# Patient Record
Sex: Male | Born: 1970
Health system: Southern US, Community
[De-identification: ages and names within clinical notes are randomized; demographics above are authoritative.]

## PROBLEM LIST (undated history)

## (undated) DIAGNOSIS — J9819 Other pulmonary collapse: Secondary | ICD-10-CM

## (undated) DIAGNOSIS — M5430 Sciatica, unspecified side: Secondary | ICD-10-CM

## (undated) DIAGNOSIS — S3992XA Unspecified injury of lower back, initial encounter: Secondary | ICD-10-CM

## (undated) DIAGNOSIS — E785 Hyperlipidemia, unspecified: Secondary | ICD-10-CM

## (undated) HISTORY — PX: LUNG SURGERY: SHX703

## (undated) HISTORY — PX: BACK SURGERY: SHX140

---

## 1997-09-16 ENCOUNTER — Ambulatory Visit (HOSPITAL_COMMUNITY): Admission: RE | Admit: 1997-09-16 | Discharge: 1997-09-16 | Payer: Self-pay | Admitting: *Deleted

## 1998-06-30 ENCOUNTER — Encounter: Payer: Self-pay | Admitting: *Deleted

## 1998-06-30 ENCOUNTER — Ambulatory Visit (HOSPITAL_COMMUNITY): Admission: RE | Admit: 1998-06-30 | Discharge: 1998-06-30 | Payer: Self-pay | Admitting: *Deleted

## 1999-02-14 ENCOUNTER — Inpatient Hospital Stay (HOSPITAL_COMMUNITY): Admission: EM | Admit: 1999-02-14 | Discharge: 1999-02-18 | Payer: Self-pay | Admitting: Emergency Medicine

## 1999-02-14 ENCOUNTER — Encounter: Payer: Self-pay | Admitting: Emergency Medicine

## 1999-02-14 ENCOUNTER — Encounter: Payer: Self-pay | Admitting: Thoracic Surgery

## 1999-02-15 ENCOUNTER — Encounter: Payer: Self-pay | Admitting: Thoracic Surgery

## 1999-02-16 ENCOUNTER — Encounter: Payer: Self-pay | Admitting: Thoracic Surgery

## 1999-02-17 ENCOUNTER — Encounter: Payer: Self-pay | Admitting: Thoracic Surgery

## 1999-02-18 ENCOUNTER — Encounter: Payer: Self-pay | Admitting: Thoracic Surgery

## 1999-05-24 ENCOUNTER — Encounter (INDEPENDENT_AMBULATORY_CARE_PROVIDER_SITE_OTHER): Payer: Self-pay

## 1999-05-24 ENCOUNTER — Inpatient Hospital Stay (HOSPITAL_COMMUNITY): Admission: EM | Admit: 1999-05-24 | Discharge: 1999-05-30 | Payer: Self-pay | Admitting: Emergency Medicine

## 1999-05-24 ENCOUNTER — Encounter: Payer: Self-pay | Admitting: Thoracic Surgery (Cardiothoracic Vascular Surgery)

## 1999-05-24 ENCOUNTER — Encounter: Payer: Self-pay | Admitting: Emergency Medicine

## 1999-05-25 ENCOUNTER — Encounter: Payer: Self-pay | Admitting: Thoracic Surgery (Cardiothoracic Vascular Surgery)

## 1999-05-26 ENCOUNTER — Encounter: Payer: Self-pay | Admitting: Thoracic Surgery (Cardiothoracic Vascular Surgery)

## 1999-05-27 ENCOUNTER — Encounter: Payer: Self-pay | Admitting: Thoracic Surgery (Cardiothoracic Vascular Surgery)

## 1999-05-28 ENCOUNTER — Encounter: Payer: Self-pay | Admitting: Thoracic Surgery (Cardiothoracic Vascular Surgery)

## 1999-05-29 ENCOUNTER — Encounter: Payer: Self-pay | Admitting: Cardiothoracic Surgery

## 1999-05-30 ENCOUNTER — Encounter: Payer: Self-pay | Admitting: Cardiothoracic Surgery

## 2000-12-07 ENCOUNTER — Encounter: Payer: Self-pay | Admitting: Pulmonary Disease

## 2000-12-07 ENCOUNTER — Ambulatory Visit (HOSPITAL_COMMUNITY): Admission: RE | Admit: 2000-12-07 | Discharge: 2000-12-07 | Payer: Self-pay | Admitting: Pulmonary Disease

## 2007-05-28 ENCOUNTER — Emergency Department (HOSPITAL_COMMUNITY): Admission: EM | Admit: 2007-05-28 | Discharge: 2007-05-28 | Payer: Self-pay | Admitting: Emergency Medicine

## 2007-07-28 ENCOUNTER — Emergency Department (HOSPITAL_COMMUNITY): Admission: EM | Admit: 2007-07-28 | Discharge: 2007-07-28 | Payer: Self-pay | Admitting: Emergency Medicine

## 2007-11-06 ENCOUNTER — Encounter: Payer: Self-pay | Admitting: Family Medicine

## 2007-11-06 ENCOUNTER — Ambulatory Visit: Payer: Self-pay | Admitting: *Deleted

## 2007-11-06 ENCOUNTER — Ambulatory Visit: Payer: Self-pay | Admitting: Internal Medicine

## 2007-11-06 LAB — CONVERTED CEMR LAB
AST: 13 units/L (ref 0–37)
Albumin: 4.7 g/dL (ref 3.5–5.2)
BUN: 12 mg/dL (ref 6–23)
Calcium: 9.7 mg/dL (ref 8.4–10.5)
Chloride: 106 meq/L (ref 96–112)
Cholesterol: 189 mg/dL (ref 0–200)
Creatinine, Ser: 0.99 mg/dL (ref 0.40–1.50)
Lymphs Abs: 1.8 10*3/uL (ref 0.7–4.0)
Monocytes Absolute: 0.6 10*3/uL (ref 0.1–1.0)
Monocytes Relative: 11 % (ref 3–12)
Neutrophils Relative %: 49 % (ref 43–77)
Platelets: 243 10*3/uL (ref 150–400)
RDW: 13.9 % (ref 11.5–15.5)
Sodium: 142 meq/L (ref 135–145)
TSH: 0.759 microintl units/mL (ref 0.350–5.50)
Total Bilirubin: 0.5 mg/dL (ref 0.3–1.2)
Total CHOL/HDL Ratio: 4.8
Total Protein: 6.8 g/dL (ref 6.0–8.3)
Triglycerides: 106 mg/dL (ref <150)
VLDL: 21 mg/dL (ref 0–40)
Vitamin B-12: 376 pg/mL (ref 211–911)
WBC: 5 10*3/uL (ref 4.0–10.5)

## 2007-12-20 ENCOUNTER — Ambulatory Visit: Payer: Self-pay | Admitting: Internal Medicine

## 2008-01-06 ENCOUNTER — Ambulatory Visit: Payer: Self-pay | Admitting: Internal Medicine

## 2009-09-26 ENCOUNTER — Emergency Department (HOSPITAL_COMMUNITY): Admission: EM | Admit: 2009-09-26 | Discharge: 2009-09-26 | Payer: Self-pay | Admitting: Emergency Medicine

## 2009-10-06 ENCOUNTER — Emergency Department (HOSPITAL_COMMUNITY): Admission: EM | Admit: 2009-10-06 | Discharge: 2009-10-06 | Payer: Self-pay | Admitting: Emergency Medicine

## 2009-11-02 ENCOUNTER — Ambulatory Visit: Payer: Self-pay | Admitting: Family Medicine

## 2009-12-07 ENCOUNTER — Ambulatory Visit: Payer: Self-pay | Admitting: Internal Medicine

## 2010-02-11 ENCOUNTER — Encounter (INDEPENDENT_AMBULATORY_CARE_PROVIDER_SITE_OTHER): Payer: Self-pay | Admitting: Family Medicine

## 2010-02-11 LAB — CONVERTED CEMR LAB
ALT: 8 units/L (ref 0–53)
AST: 13 units/L (ref 0–37)
Albumin: 4.9 g/dL (ref 3.5–5.2)
Alkaline Phosphatase: 80 units/L (ref 39–117)
CO2: 28 meq/L (ref 19–32)
Calcium: 10.2 mg/dL (ref 8.4–10.5)
Creatinine, Ser: 0.98 mg/dL (ref 0.40–1.50)
Eosinophils Relative: 1 % (ref 0–5)
HCT: 46.1 % (ref 39.0–52.0)
Lymphocytes Relative: 17 % (ref 12–46)
Lymphs Abs: 1.7 10*3/uL (ref 0.7–4.0)
Monocytes Absolute: 0.7 10*3/uL (ref 0.1–1.0)
Neutro Abs: 7.7 10*3/uL (ref 1.7–7.7)
RDW: 13.2 % (ref 11.5–15.5)
Total Bilirubin: 0.5 mg/dL (ref 0.3–1.2)
Total CHOL/HDL Ratio: 5.6
Total Protein: 7.1 g/dL (ref 6.0–8.3)
Triglycerides: 215 mg/dL — ABNORMAL HIGH (ref <150)

## 2010-02-14 ENCOUNTER — Encounter (INDEPENDENT_AMBULATORY_CARE_PROVIDER_SITE_OTHER): Payer: Self-pay | Admitting: Family Medicine

## 2010-02-14 LAB — CONVERTED CEMR LAB: Hgb A1c MFr Bld: 5.6 % (ref <5.7)

## 2010-05-23 ENCOUNTER — Encounter (INDEPENDENT_AMBULATORY_CARE_PROVIDER_SITE_OTHER): Payer: Self-pay | Admitting: Internal Medicine

## 2010-05-23 LAB — CONVERTED CEMR LAB
Cocaine Metabolites: NEGATIVE
Creatinine,U: 195.2 mg/dL
Opiate Screen, Urine: NEGATIVE

## 2010-09-30 NOTE — Discharge Summary (Signed)
[place]. [facility]  Patient:    James Dudley                         MRN: [id number] Adm. Date:  [id number] Disc. Date: [id number] Attending:  Charlett Lango Dictator:   Luretha Rued Vedia Pereyra. CC:         Salvatore Decent. Dorris Fetch, M.D.             Nicoletta Dress. Colon Branch, M.D.             Lonzo Cloud. Kriste Basque, M.D. [facility]                           Discharge Summary  DATE OF BIRTH:  08-16-1970  ADMISSION DIAGNOSES: 1. Spontaneous pneumothorax which is recurrent. 2. History of spontaneous pneumothorax in October 2000, on the left. 3. History of pneumonia. 4. Depression. 5. Allergic rhinitis. 6. History of ongoing tobacco use. 7. Allergy to codeine.  DISCHARGE DIAGNOSES: 1. Spontaneous recurrent pneumothorax on the left, now status post tube    thoracotomy. 2. History of spontaneous pneumothorax on the left in October 2000. 3. History of pneumonia in 1983. 4. Depression. 5. Allergic rhinitis. 6. History of ongoing tobacco use. 7. Allergy to codeine.  HISTORY OF PRESENT ILLNESS:  Mr. James Dudley is a 40 year old patient who awoke the morning of his admission on May 24, 1999, with the feeling of weight on his chest.  He has not had shortness of breath, but has had trouble taking deep breaths.  He has pain upon deep inspiration.  He presented to the emergency room, and was found to have a left spontaneous pneumothorax of about 30-40%.  ALLERGIES:  MORPHINE, CODEINE, and PENICILLIN.  PAST MEDICAL HISTORY: 1. Spontaneous left pneumothorax in October 2000, treated with a left chest    tube. 2. History of depression. 3. Allergic rhinitis. 4. Ongoing tobacco use. 5. History of pneumonia in 1983, requiring hospitalization.  HOSPITAL COURSE:  [name] was admitted to the hospital on May 24, 1999, after reporting to the emergency room, and was found to have a 30-40% left spontaneous pneumothorax.  A chest tube was inserted without difficulty, and on  May 26, 1999, Mr. Hawes underwent a left video-assisted thoracic surgery with apical blebectomy, apical pleuralectomy, and pleural adhesions by Dr. Charlett Lango.  He tolerated this without difficulty, and was transferred to the SICU in stable condition.  By postoperative day #1, he was doing well.  He had a small air leak.  He remained on suction and water seal the next 24 hours.  By postoperative day #2, he had no air leak, and his chest tube was taken out on May 28, 1999.  Mr. Candelas remained in the hospital for another 24-48 hours for observation and follow-up chest x-rays.  His follow-up chest x-rays were satisfactory, and he was discharged home on May 30, 1999.  DISCHARGE MEDICATIONS: 1. Percocet 1-2 tablets every four to six hours p.r.n. pain. 2. Zyrtec 10 mg p.o. q.d. 3. Celebrex 40 mg p.o. q.d.  FOLLOW-UP:  He is to follow up with Dr. Dorris Fetch in one week, with a chest x-ray from [facility].  DISCHARGE INSTRUCTIONS:  He is to perform no heavy lifting, no strenuous activity, or not to work.  He is to stop smoking.  He may keep his wounds clean and dry.  He may shower as desired. DD:  08/03/99 TD:  08/03/99 Job: [id number]

## 2010-09-30 NOTE — Op Note (Signed)
[place]. [facility]  Patient:    James Dudley                         MRN: [id number] Proc. Date: 05/26/99 Adm. Date:  [id number] Disc. Date: [id number] Attending:  Charlett Lango                           Operative Report  PREOPERATIVE DIAGNOSIS:  Recurrent left spontaneous pneumothorax.  POSTOPERATIVE DIAGNOSIS:  Recurrent left spontaneous pneumothorax.  OPERATION PERFORMED:  Left video-assisted thorascopic surgery (VATS) with apical blebectomy, apical pleurectomy and pleural abrasion.  SURGEON:  Salvatore Decent. Dorris Fetch, M.D.  ASSISTANT:  Eugenia Pancoast, P.A.  ANESTHESIA:  General.  OPERATIVE FINDINGS:  Apical bleb disease, no blebs in the remaining lung. Remainder of lung appeared normal.  CLINICAL NOTE:  The patient is a 40 year old smoker who presented to the emergency room on May 24, 1999 with a recurrent left spontaneous pneumothorax.  He had had a previous spontaneous pneumothorax in October of 2000 and was treated with a chest tube by Dr. Karle Plumber.  Because of the recurrent nature of the pneumothorax, the patient was advised to undergo a video assisted thoracoscopy and apical blebectomy.  A chest tube was placed in the emergency room to re-expand the lung and relieve his symptoms.  He was informed of the indications, risks, benefits nd alternatives for the treatment of recurrent pneumothorax.  These are documented in the patients chart.  He understood these risks and agreed to proceed.  DESCRIPTION OF PROCEDURE:  James Dudley was brought to the preop holding area on May 26, 1999.  Lines were placed for intravenous access and arterial blood pressure monitoring.  The patient was taken to the operating room, anesthetized and intubated with a double lumen endotracheal tube.  The patient was placed in a right lateral decubitus position and the left chest was prepped and draped in the usual fashion.  Single lung  ventilation was carried out of the right lung.  The left ung was deflated.  An incision was made in the eighth intercostal space in the midaxillary line, carried through the skin and subcutaneous tissues. Hemostasis was achieved with cautery.  The chest was then entered carefully using a hemostat going over the top edge of the ninth rib.  The thoracoscope then was inserted.  There was good deflation of the lung.  The pleural cavity was inspected.  There  were some minimal adhesions near the apex from the patients prior chest tube and pneumothorax.  These were taken down.  The apex then was inspected and there was large apical bleb.  The remainder of the lung was free of any blebs.  The apical blebs were removed with sequential firing of an automatic stapling device.  The  specimen was removed from the chest and sent for pathology.  After inspection of the staple line to ensure there was hemostasis, the parietal pleura at the apex was grasped and gently stripped away beginning at the apex and carried down to the level of the third rib circumferentially.  The remainder of the pleural surface was abraded with gauze to promote adhesion formation.  A 27 French chest tube then as placed through the anteriormost fifth intercostal space incision directed to the apex and secured with a #1 silk suture.  All chest entry sites were inspected for hemostasis.  The scope was withdrawn.  The remaining two incisions were  closed ith a 0 Vicryl fascial suture, a 2-0 Vicryl suture subcutaneous suture and a 3-0 Vicryl subcuticular suture.  All sponge, needle and instrument counts were correct at he end of the procedure.  There were no intraoperative complications.  There was good reinflation of the lung.  The patient was taken from the operating room to the ost anesthesia care unit extubated in stable condition. DD:  07/13/99 TD:  07/13/99 Job: [id number]

## 2010-12-04 ENCOUNTER — Emergency Department (HOSPITAL_COMMUNITY): Payer: No Typology Code available for payment source

## 2010-12-04 ENCOUNTER — Emergency Department (HOSPITAL_COMMUNITY)
Admission: EM | Admit: 2010-12-04 | Discharge: 2010-12-04 | Disposition: A | Discharge status: Home / Self Care | Payer: No Typology Code available for payment source | Attending: Emergency Medicine | Admitting: Emergency Medicine

## 2010-12-04 DIAGNOSIS — M545 Low back pain, unspecified: Secondary | ICD-10-CM | POA: Insufficient documentation

## 2010-12-04 DIAGNOSIS — F17200 Nicotine dependence, unspecified, uncomplicated: Secondary | ICD-10-CM | POA: Insufficient documentation

## 2010-12-04 DIAGNOSIS — Z79899 Other long term (current) drug therapy: Secondary | ICD-10-CM | POA: Insufficient documentation

## 2010-12-04 DIAGNOSIS — IMO0002 Reserved for concepts with insufficient information to code with codable children: Secondary | ICD-10-CM | POA: Insufficient documentation

## 2010-12-04 DIAGNOSIS — G8929 Other chronic pain: Secondary | ICD-10-CM | POA: Insufficient documentation

## 2010-12-04 LAB — URINALYSIS, ROUTINE W REFLEX MICROSCOPIC
Ketones, ur: NEGATIVE mg/dL
Nitrite: NEGATIVE
Protein, ur: NEGATIVE mg/dL
Specific Gravity, Urine: 1.009 (ref 1.005–1.030)

## 2011-01-05 ENCOUNTER — Emergency Department (HOSPITAL_BASED_OUTPATIENT_CLINIC_OR_DEPARTMENT_OTHER)
Admission: EM | Admit: 2011-01-05 | Discharge: 2011-01-05 | Disposition: A | Discharge status: Home / Self Care | Payer: Medicaid Other | Attending: Emergency Medicine | Admitting: Emergency Medicine

## 2011-01-05 DIAGNOSIS — E785 Hyperlipidemia, unspecified: Secondary | ICD-10-CM | POA: Insufficient documentation

## 2011-01-05 DIAGNOSIS — F17200 Nicotine dependence, unspecified, uncomplicated: Secondary | ICD-10-CM | POA: Insufficient documentation

## 2011-01-05 DIAGNOSIS — R21 Rash and other nonspecific skin eruption: Secondary | ICD-10-CM | POA: Insufficient documentation

## 2011-01-05 DIAGNOSIS — T7840XA Allergy, unspecified, initial encounter: Secondary | ICD-10-CM

## 2011-01-05 HISTORY — DX: Hyperlipidemia, unspecified: E78.5

## 2011-01-05 MED ORDER — PREDNISONE 20 MG PO TABS
60.0000 mg | ORAL_TABLET | Freq: Once | ORAL | Status: AC
  Administered 2011-01-05: 60 mg via ORAL

## 2011-01-05 MED ORDER — PREDNISONE 20 MG PO TABS: 60.0000 mg | ORAL_TABLET | Freq: Every day | ORAL | Status: AC

## 2011-01-05 MED ORDER — DIPHENHYDRAMINE HCL 25 MG PO CAPS: 25.0000 mg | ORAL_CAPSULE | Freq: Four times a day (QID) | ORAL | Status: AC | PRN

## 2011-01-05 MED ORDER — DIPHENHYDRAMINE HCL 25 MG PO CAPS
25.0000 mg | ORAL_CAPSULE | Freq: Once | ORAL | Status: AC
  Administered 2011-01-05: 25 mg via ORAL
  Filled 2011-01-05: qty 1

## 2011-01-05 MED ORDER — FAMOTIDINE 20 MG PO TABS
20.0000 mg | ORAL_TABLET | Freq: Once | ORAL | Status: AC
  Administered 2011-01-05: 20 mg via ORAL

## 2011-01-05 MED ORDER — FAMOTIDINE 20 MG PO TABS: 20.0000 mg | ORAL_TABLET | Freq: Two times a day (BID) | ORAL | Status: DC | PRN

## 2011-01-05 NOTE — ED Provider Notes (Signed)
History     CSN: [id number] Arrival date & time: 01/05/2011  1:52 AM  Chief Complaint  Patient presents with  . Rash  . Allergic Reaction   Patient is a 40 y.o. male presenting with rash and allergic reaction. The history is provided by the patient.  Rash  This is a new problem. The current episode started 1 to 2 hours ago. The problem has been gradually improving. The problem is associated with an unknown factor. There has been no fever. The rash is present on the torso, left buttock, left upper leg, left arm, right arm, right buttock and right upper leg. The patient is experiencing no pain. Associated symptoms include itching and pain. Pertinent negatives include no blisters. He has tried anti-itch cream for the symptoms. The treatment provided no relief. Risk factors: no new meds, foods or known exposures.  Allergic Reaction The primary symptoms are  rash. The primary symptoms do not include shortness of breath, abdominal pain or palpitations.  The rash is associated with itching. The rash is not associated with blisters.  Significant symptoms also include itching.  went to bed and woke with severe itching and red migrating rash.  No h/o same, ate sub sandwhich that he eats regularily and no other new foods or ingestions. No trouble breathing, throat swelling or lip/ tongue swelling  Past Medical History  Diagnosis Date  . Hyperlipemia     Past Surgical History  Procedure Date  . Back surgery   . Lung surgery     History reviewed. No pertinent family history.  History  Substance Use Topics  . Smoking status: Current Everyday Smoker -- 1.0 packs/day for 25 years    Types: Cigarettes  . Smokeless tobacco: Never Used  . Alcohol Use: No      Review of Systems  Constitutional: Negative for fever, chills and diaphoresis.  HENT: Negative for neck pain and neck stiffness.   Eyes: Negative for pain.  Respiratory: Negative for shortness of breath.   Cardiovascular: Negative for  chest pain and palpitations.  Gastrointestinal: Negative for abdominal pain.  Genitourinary: Negative for dysuria.  Musculoskeletal: Negative for back pain.  Skin: Positive for itching and rash.  Neurological: Negative for weakness and headaches.  All other systems reviewed and are negative.    Physical Exam  BP 119/78  Pulse 84  Temp(Src) 97.4 F (36.3 C) (Oral)  Resp 19  Ht 6' (1.829 m)  Wt 115 lb (52.164 kg)  BMI 15.60 kg/m2  SpO2 95%  Physical Exam  Constitutional: He is oriented to person, place, and time. He appears well-developed and well-nourished.  HENT:  Head: Normocephalic and atraumatic.  Eyes: Conjunctivae and EOM are normal. Pupils are equal, round, and reactive to light.  Neck: Trachea normal. Neck supple. No thyromegaly present.  Cardiovascular: Normal rate, regular rhythm, S1 normal, S2 normal and normal pulses.     No systolic murmur is present   No diastolic murmur is present  Pulses:      Radial pulses are 2+ on the right side, and 2+ on the left side.  Pulmonary/Chest: Effort normal and breath sounds normal. He has no wheezes. He has no rhonchi. He has no rales. He exhibits no tenderness.  Abdominal: Soft. Normal appearance and bowel sounds are normal. There is no tenderness. There is no CVA tenderness and negative Murphy's sign.  Musculoskeletal:       BLE:s Calves nontender, no cords or erythema, negative Homans sign  Neurological: He is alert and oriented  to person, place, and time. He has normal strength. No cranial nerve deficit or sensory deficit. GCS eye subscore is 4. GCS verbal subscore is 5. GCS motor subscore is 6.  Skin: Skin is warm and dry. Rash noted. Rash is urticarial. He is not diaphoretic. There is erythema.       Diffuse rash to extremities and torso, erythematous blanching rash c/w allergic reaction  Psychiatric: His speech is normal.       Cooperative and appropriate    ED Course  Procedures  MDM  Presentation c/w allergic  reaction no anaphylaxis or airway involvement. Improved with benadryl, prednisone and pepcid.  Anaphylaxis precuations given and rec food diary. PT understand recs for PCP follow up as needed and return here for any SOB or airway swelling.        Sunnie Nielsen, MD 01/05/11 [phone]

## 2011-01-05 NOTE — ED Notes (Signed)
Pt states that when he went to bed tonight that he started itching and broke out in hives.  Pt put some hydrocortisone cream on these areas and this did not improve symptoms.  Pt states that he feels like he is not breathing easily.  In nad this time, vss will continue to monitor.  Pt states that he is unaware of any cause for this reaction.

## 2011-02-06 LAB — POCT CARDIAC MARKERS
CKMB, poc: <1 — ABNORMAL LOW
Myoglobin, poc: 30.5
Operator id: 4531
Troponin i, poc: <0.05

## 2011-02-06 LAB — DIFFERENTIAL
Basophils Relative: 0
Lymphs Abs: 2.7
Monocytes Relative: 9
Neutrophils Relative %: 51

## 2011-02-06 LAB — BASIC METABOLIC PANEL
BUN: 6
Calcium: 9.3
Creatinine, Ser: 0.75
Glucose, Bld: 89
Sodium: 139

## 2011-02-06 LAB — CBC
Hemoglobin: 13.9
MCHC: 33.4
MCV: 90.9

## 2011-02-16 ENCOUNTER — Other Ambulatory Visit (HOSPITAL_COMMUNITY): Payer: Self-pay | Admitting: Family Medicine

## 2011-02-16 ENCOUNTER — Ambulatory Visit (HOSPITAL_COMMUNITY)
Admission: RE | Admit: 2011-02-16 | Discharge: 2011-02-16 | Disposition: A | Discharge status: Home / Self Care | Payer: Self-pay | Source: Ambulatory Visit | Attending: Family Medicine | Admitting: Family Medicine

## 2011-02-16 DIAGNOSIS — J984 Other disorders of lung: Secondary | ICD-10-CM | POA: Insufficient documentation

## 2011-02-16 DIAGNOSIS — R0989 Other specified symptoms and signs involving the circulatory and respiratory systems: Secondary | ICD-10-CM

## 2011-05-19 ENCOUNTER — Emergency Department (HOSPITAL_COMMUNITY)
Admission: EM | Admit: 2011-05-19 | Discharge: 2011-05-19 | Disposition: A | Discharge status: Home / Self Care | Payer: Medicaid Other | Attending: Emergency Medicine | Admitting: Emergency Medicine

## 2011-05-19 ENCOUNTER — Encounter (HOSPITAL_COMMUNITY): Payer: Self-pay | Admitting: Emergency Medicine

## 2011-05-19 DIAGNOSIS — X500XXA Overexertion from strenuous movement or load, initial encounter: Secondary | ICD-10-CM | POA: Insufficient documentation

## 2011-05-19 DIAGNOSIS — E785 Hyperlipidemia, unspecified: Secondary | ICD-10-CM | POA: Insufficient documentation

## 2011-05-19 DIAGNOSIS — F17200 Nicotine dependence, unspecified, uncomplicated: Secondary | ICD-10-CM | POA: Insufficient documentation

## 2011-05-19 DIAGNOSIS — Z79899 Other long term (current) drug therapy: Secondary | ICD-10-CM | POA: Insufficient documentation

## 2011-05-19 DIAGNOSIS — M5380 Other specified dorsopathies, site unspecified: Secondary | ICD-10-CM | POA: Insufficient documentation

## 2011-05-19 DIAGNOSIS — M549 Dorsalgia, unspecified: Secondary | ICD-10-CM | POA: Insufficient documentation

## 2011-05-19 DIAGNOSIS — IMO0002 Reserved for concepts with insufficient information to code with codable children: Secondary | ICD-10-CM | POA: Insufficient documentation

## 2011-05-19 DIAGNOSIS — T148XXA Other injury of unspecified body region, initial encounter: Secondary | ICD-10-CM

## 2011-05-19 HISTORY — DX: Unspecified injury of lower back, initial encounter: S39.92XA

## 2011-05-19 HISTORY — DX: Sciatica, unspecified side: M54.30

## 2011-05-19 MED ORDER — IBUPROFEN 800 MG PO TABS: 800.0000 mg | ORAL_TABLET | Freq: Three times a day (TID) | ORAL | Status: AC

## 2011-05-19 MED ORDER — IBUPROFEN 800 MG PO TABS
800.0000 mg | ORAL_TABLET | Freq: Once | ORAL | Status: AC
  Administered 2011-05-19: 800 mg via ORAL
  Filled 2011-05-19: qty 1

## 2011-05-19 NOTE — ED Notes (Signed)
[id number]<AV> Expected date:05/19/11<BR> Expected time: 9:30 AM<BR> Means of arrival:Ambulance<BR> Comments:<BR> Possible hip fracture

## 2011-05-19 NOTE — ED Notes (Signed)
Pt was attempting to lift box out of truck, pain in mid back stopped activity

## 2011-05-19 NOTE — ED Provider Notes (Signed)
History     CSN: [id number]  Arrival date & time 05/19/11  5784   First MD Initiated Contact with Patient 05/19/11 1025      Chief Complaint  Patient presents with  . Back Pain    Mid back pain after lifting box 1 hr ago. Hx of back surgery    (Consider location/radiation/quality/duration/timing/severity/associated sxs/prior treatment) HPI  Patient states he has a history of some chronic lower back problems but goes to his primary care physician, Dr. Clelia Croft for his chronic back pain presents to emergency department complaining of acute on chronic back pain stating that he was bending over the bed of his truck to pick up a box and felt a sharp pull in his mid back a couple hours PTA. Patient states he immediately went inside and laid down due to pain. Patient states when he attempted to get off the floor he felt like "I couldn't catch my breath because of the pain." His wife had to assist him from the ground and then called Dr. Clelia Croft. Because patient had such severe pain Dr. Clelia Croft recommended going to the ER for further evaluation. Patient states the pain is in his left mid back. Denies radiation of pain. Pain is been constant since injury. He denies radiation of pain into his extremities. He denies numbness/timing/weakness of the of the extremities. He denies shortness of breath. He denies loss of bowel or bladder function, saddle seat paresthesias. Patient has not taken anything for pain PTA.  Past Medical History  Diagnosis Date  . Hyperlipemia   . Back injury   . Sciatic pain     Past Surgical History  Procedure Date  . Back surgery   . Lung surgery   . Lung surgery     atalectasis    Family History  Problem Relation Age of Onset  . Diabetes Mother   . Hypertension Other     History  Substance Use Topics  . Smoking status: Current Everyday Smoker -- 1.0 packs/day for 25 years    Types: Cigarettes  . Smokeless tobacco: Never Used  . Alcohol Use: No      Review of  Systems  All other systems reviewed and are negative.    Allergies  Codeine and Penicillins  Home Medications   Current Outpatient Rx  Name Route Sig Dispense Refill  . GABAPENTIN 600 MG PO TABS Oral Take 600 mg by mouth 3 (three) times daily.      Marland Kitchen FAMOTIDINE 20 MG PO TABS Oral Take 1 tablet (20 mg total) by mouth 2 (two) times daily as needed. 20 tablet 0  . IBUPROFEN 800 MG PO TABS Oral Take 1 tablet (800 mg total) by mouth 3 (three) times daily. 21 tablet 0    BP 104/75  Pulse 78  Temp(Src) 97.8 F (36.6 C) (Oral)  SpO2 98%  Physical Exam  Nursing note and vitals reviewed. Constitutional: He is oriented to person, place, and time. He appears well-developed and well-nourished. No distress.  HENT:  Head: Normocephalic and atraumatic.  Eyes: Conjunctivae are normal.  Neck: Normal range of motion. Neck supple.  Cardiovascular: Normal rate, regular rhythm, normal heart sounds and intact distal pulses.  Exam reveals no gallop and no friction rub.   No murmur heard. Pulmonary/Chest: Effort normal and breath sounds normal. No respiratory distress. He has no wheezes. He has no rales. He exhibits no tenderness.  Abdominal: Bowel sounds are normal. He exhibits no distension and no mass. There is no tenderness. There  is no rebound and no guarding.  Musculoskeletal: Normal range of motion. He exhibits tenderness. He exhibits no edema.       Tenderness to palpation of soft tissue of left mid back no spinal midline tenderness to palpation. There is muscle spasticity and left mid back. No skin changes.  Neurological: He is alert and oriented to person, place, and time. He has normal reflexes.  Skin: Skin is warm and dry. No rash noted. He is not diaphoretic. No erythema.  Psychiatric: He has a normal mood and affect.    ED Course  Procedures (including critical care time)  By mouth Motrin.  Patient declines additional pain medication stating that "I don't like to take pills."  Patient declines both narcotic pain medicines and muscle relaxers stating "I just want prescription ibuprofen."  Labs Reviewed - No data to display No results found.   1. Muscle strain       MDM  Patient with muscle strain of left mid back but no signs or symptoms of central cord compression or cauda equina. Patient is requesting only NSAIDs for treatment of his back pain declining narcotics and muscle relaxers. Patient is ambulating without difficulty. Bilateral upper and lower extremities are neurovascularly intact with 5 out of 5 strength. Normal reflexes        Lenon Oms [place], [place] 05/19/11 1218

## 2011-05-20 NOTE — ED Provider Notes (Signed)
Medical screening examination/treatment/procedure(s) were performed by non-physician practitioner and as supervising physician I was immediately available for consultation/collaboration.   Benny Lennert, MD 05/20/11 [phone]

## 2011-08-15 ENCOUNTER — Ambulatory Visit: Payer: Medicaid Other

## 2011-09-14 ENCOUNTER — Other Ambulatory Visit (HOSPITAL_COMMUNITY): Payer: Self-pay | Admitting: Family Medicine

## 2011-09-14 DIAGNOSIS — M5410 Radiculopathy, site unspecified: Secondary | ICD-10-CM

## 2011-09-14 DIAGNOSIS — R269 Unspecified abnormalities of gait and mobility: Secondary | ICD-10-CM

## 2011-09-14 DIAGNOSIS — M545 Low back pain: Secondary | ICD-10-CM

## 2011-09-19 ENCOUNTER — Ambulatory Visit (HOSPITAL_COMMUNITY)
Admission: RE | Admit: 2011-09-19 | Discharge: 2011-09-19 | Disposition: A | Discharge status: Home / Self Care | Payer: Medicaid Other | Source: Ambulatory Visit | Attending: Family Medicine | Admitting: Family Medicine

## 2011-09-19 DIAGNOSIS — M5410 Radiculopathy, site unspecified: Secondary | ICD-10-CM

## 2011-09-19 DIAGNOSIS — R269 Unspecified abnormalities of gait and mobility: Secondary | ICD-10-CM

## 2011-09-19 DIAGNOSIS — M545 Low back pain, unspecified: Secondary | ICD-10-CM | POA: Insufficient documentation

## 2012-08-25 ENCOUNTER — Emergency Department (HOSPITAL_BASED_OUTPATIENT_CLINIC_OR_DEPARTMENT_OTHER)
Admission: EM | Admit: 2012-08-25 | Discharge: 2012-08-25 | Disposition: A | Discharge status: Home / Self Care | Payer: Medicaid Other | Attending: Emergency Medicine | Admitting: Emergency Medicine

## 2012-08-25 ENCOUNTER — Encounter (HOSPITAL_BASED_OUTPATIENT_CLINIC_OR_DEPARTMENT_OTHER): Payer: Self-pay | Admitting: Emergency Medicine

## 2012-08-25 DIAGNOSIS — R5381 Other malaise: Secondary | ICD-10-CM | POA: Insufficient documentation

## 2012-08-25 DIAGNOSIS — R630 Anorexia: Secondary | ICD-10-CM | POA: Insufficient documentation

## 2012-08-25 DIAGNOSIS — R42 Dizziness and giddiness: Secondary | ICD-10-CM | POA: Insufficient documentation

## 2012-08-25 DIAGNOSIS — Z8739 Personal history of other diseases of the musculoskeletal system and connective tissue: Secondary | ICD-10-CM | POA: Insufficient documentation

## 2012-08-25 DIAGNOSIS — K529 Noninfective gastroenteritis and colitis, unspecified: Secondary | ICD-10-CM

## 2012-08-25 DIAGNOSIS — Z862 Personal history of diseases of the blood and blood-forming organs and certain disorders involving the immune mechanism: Secondary | ICD-10-CM | POA: Insufficient documentation

## 2012-08-25 DIAGNOSIS — R197 Diarrhea, unspecified: Secondary | ICD-10-CM | POA: Insufficient documentation

## 2012-08-25 DIAGNOSIS — R51 Headache: Secondary | ICD-10-CM | POA: Insufficient documentation

## 2012-08-25 DIAGNOSIS — F17200 Nicotine dependence, unspecified, uncomplicated: Secondary | ICD-10-CM | POA: Insufficient documentation

## 2012-08-25 DIAGNOSIS — R5383 Other fatigue: Secondary | ICD-10-CM | POA: Insufficient documentation

## 2012-08-25 DIAGNOSIS — E860 Dehydration: Secondary | ICD-10-CM | POA: Insufficient documentation

## 2012-08-25 DIAGNOSIS — Z87828 Personal history of other (healed) physical injury and trauma: Secondary | ICD-10-CM | POA: Insufficient documentation

## 2012-08-25 DIAGNOSIS — Z8639 Personal history of other endocrine, nutritional and metabolic disease: Secondary | ICD-10-CM | POA: Insufficient documentation

## 2012-08-25 DIAGNOSIS — K5289 Other specified noninfective gastroenteritis and colitis: Secondary | ICD-10-CM | POA: Insufficient documentation

## 2012-08-25 DIAGNOSIS — Z79899 Other long term (current) drug therapy: Secondary | ICD-10-CM | POA: Insufficient documentation

## 2012-08-25 LAB — URINALYSIS, ROUTINE W REFLEX MICROSCOPIC
Hgb urine dipstick: NEGATIVE
Ketones, ur: 15 mg/dL — AB
Specific Gravity, Urine: 1.029 (ref 1.005–1.030)
Urobilinogen, UA: 1 mg/dL (ref 0.0–1.0)
pH: 6 (ref 5.0–8.0)

## 2012-08-25 LAB — CBC WITH DIFFERENTIAL/PLATELET
Eosinophils Absolute: 0 10*3/uL (ref 0.0–0.7)
Hemoglobin: 15.2 g/dL (ref 13.0–17.0)
Lymphocytes Relative: 22 % (ref 12–46)
Lymphs Abs: 1.2 10*3/uL (ref 0.7–4.0)
Monocytes Relative: 15 % — ABNORMAL HIGH (ref 3–12)
Neutro Abs: 3.4 10*3/uL (ref 1.7–7.7)
Neutrophils Relative %: 63 % (ref 43–77)
Platelets: 265 10*3/uL (ref 150–400)
RBC: 5.05 MIL/uL (ref 4.22–5.81)
WBC: 5.4 10*3/uL (ref 4.0–10.5)

## 2012-08-25 LAB — COMPREHENSIVE METABOLIC PANEL
ALT: 31 U/L (ref 0–53)
Alkaline Phosphatase: 68 U/L (ref 39–117)
BUN: 28 mg/dL — ABNORMAL HIGH (ref 6–23)
CO2: 26 mEq/L (ref 19–32)
Chloride: 102 mEq/L (ref 96–112)
GFR calc Af Amer: >90 mL/min (ref >90)
Glucose, Bld: 105 mg/dL — ABNORMAL HIGH (ref 70–99)
Potassium: 3.8 mEq/L (ref 3.5–5.1)
Sodium: 141 mEq/L (ref 135–145)
Total Bilirubin: 0.5 mg/dL (ref 0.3–1.2)
Total Protein: 7.9 g/dL (ref 6.0–8.3)

## 2012-08-25 LAB — LIPASE, BLOOD: Lipase: 44 U/L (ref 11–59)

## 2012-08-25 MED ORDER — ONDANSETRON HCL 4 MG/2ML IJ SOLN
4.0000 mg | Freq: Once | INTRAMUSCULAR | Status: AC
  Administered 2012-08-25: 4 mg via INTRAVENOUS
  Filled 2012-08-25: qty 2

## 2012-08-25 MED ORDER — SODIUM CHLORIDE 0.9 % IV BOLUS (SEPSIS)
2000.0000 mL | Freq: Once | INTRAVENOUS | Status: AC
  Administered 2012-08-25: 1000 mL via INTRAVENOUS

## 2012-08-25 NOTE — ED Notes (Signed)
Pt states that he cannot provide a urine specimen at this time.  Will check back shortly.

## 2012-08-25 NOTE — ED Notes (Signed)
N/V/D on Thursday.  Continues not to have poor appetite, decreased fluid intake.  Some dizziness upon walking.

## 2012-08-25 NOTE — ED Provider Notes (Signed)
History     CSN: [id number]  Arrival date & time 08/25/12  1215   First MD Initiated Contact with Patient 08/25/12 1222      Chief Complaint  Patient presents with  . Emesis  . Diarrhea  . Anorexia    (Consider location/radiation/quality/duration/timing/severity/associated sxs/prior treatment) HPI Comments: Episode of N/V/D on Thursday (4 days PTA) and some on Friday which resolved however has not eaten or drank anything yesterday or today due to anorexia and no appetite.  Denies abd pain but states mild generalized HA.  No nausea but states can't force himself to drink.  No recent abx.  States this morning his urine looks really dark but no dysuria  Patient is a 42 y.o. male presenting with vomiting and diarrhea. The history is provided by the patient.  Emesis Severity:  Moderate Duration:  4 days Timing:  Constant Number of daily episodes:  Numerous Quality:  Stomach contents Progression:  Resolved Chronicity:  New Relieved by:  Nothing Associated symptoms: diarrhea and headaches   Associated symptoms: no abdominal pain, no chills, no cough and no fever   Risk factors: no alcohol use, no diabetes, no sick contacts and no travel to endemic areas   Diarrhea Associated symptoms: headaches and vomiting   Associated symptoms: no abdominal pain, no chills, no recent cough and no fever     Past Medical History  Diagnosis Date  . Hyperlipemia   . Back injury   . Sciatic pain     Past Surgical History  Procedure Laterality Date  . Back surgery    . Lung surgery    . Lung surgery      atalectasis    Family History  Problem Relation Age of Onset  . Diabetes Mother   . Hypertension Other     History  Substance Use Topics  . Smoking status: Current Every Day Smoker -- 1.00 packs/day for 25 years    Types: Cigarettes  . Smokeless tobacco: Never Used  . Alcohol Use: No      Review of Systems  Constitutional: Positive for appetite change and fatigue. Negative  for fever and chills.  Respiratory: Negative for cough and shortness of breath.   Gastrointestinal: Positive for nausea, vomiting and diarrhea. Negative for abdominal pain.  Neurological: Positive for light-headedness and headaches.       Now lightheaded every time he stands up  All other systems reviewed and are negative.    Allergies  Codeine and Penicillins  Home Medications   Current Outpatient Rx  Name  Route  Sig  Dispense  Refill  . AMITRIPTYLINE HCL PO   Oral   Take by mouth.         . oxyCODONE-acetaminophen (PERCOCET/ROXICET) 5-325 MG per tablet   Oral   Take 1 tablet by mouth every 4 (four) hours as needed for pain.         Marland Kitchen EXPIRED: famotidine (PEPCID) 20 MG tablet   Oral   Take 1 tablet (20 mg total) by mouth 2 (two) times daily as needed.   20 tablet   0   . gabapentin (NEURONTIN) 600 MG tablet   Oral   Take 600 mg by mouth 3 (three) times daily.             BP 113/87  Pulse 90  Temp(Src) 98.6 F (37 C) (Oral)  Resp 18  Ht 6' (1.829 m)  SpO2 100%  Physical Exam  Nursing note and vitals reviewed. Constitutional: He is oriented  to person, place, and time. He appears well-developed and well-nourished. No distress.  HENT:  Head: Normocephalic and atraumatic.  Mouth/Throat: Oropharynx is clear and moist. Mucous membranes are dry.  Eyes: Conjunctivae and EOM are normal. Pupils are equal, round, and reactive to light.  Neck: Normal range of motion. Neck supple.  Cardiovascular: Normal rate, regular rhythm and intact distal pulses.   No murmur heard. Pulmonary/Chest: Effort normal and breath sounds normal. No respiratory distress. He has no wheezes. He has no rales.  Abdominal: Soft. He exhibits no distension. There is no tenderness. There is no rebound and no guarding.  Musculoskeletal: Normal range of motion. He exhibits no edema and no tenderness.  Neurological: He is alert and oriented to person, place, and time.  Skin: Skin is warm and dry.  No rash noted. No erythema.  Psychiatric: He has a normal mood and affect. His behavior is normal.    ED Course  Procedures (including critical care time)  Labs Reviewed  CBC WITH DIFFERENTIAL - Abnormal; Notable for the following:    Monocytes Relative 15 (*)    All other components within normal limits  COMPREHENSIVE METABOLIC PANEL - Abnormal; Notable for the following:    Glucose, Bld 105 (*)    BUN 28 (*)    All other components within normal limits  URINALYSIS, ROUTINE W REFLEX MICROSCOPIC - Abnormal; Notable for the following:    Bilirubin Urine SMALL (*)    Ketones, ur 15 (*)    All other components within normal limits  LIPASE, BLOOD   No results found.   1. Dehydration   2. Gastroenteritis       MDM    Pt with symptoms most consistent with a viral process with fever/vomitting/diarrhea on Thursday however since that time he has not eaten or drank anything due to not feeling well.  Denies any abd pain at this point.  Denies bad food exposure and recent travel out of the country.  No recent abx.  No hx concerning for GU pathology or kidney stones.  Pt is awake and alert on exam without peritoneal signs.  VS wnl.  Feel most likely significant dehydration due to no fluid replacement after N/V/D however denies any melena or hematemesis.  However will ensure no renal insufficiency or elevated LFTs or pancreatitis.  CBC, CMP, lipase, UA pending.  Pt given zofran and 2L NS.  3:09 PM All labs wnl.  After 2L pt feels much better and tolerating po's.  D/ced home.        Gwyneth Sprout, MD 08/25/12 [phone]

## 2013-02-05 ENCOUNTER — Ambulatory Visit: Payer: Medicaid Other | Attending: Anesthesiology

## 2013-02-05 DIAGNOSIS — M545 Low back pain, unspecified: Secondary | ICD-10-CM | POA: Insufficient documentation

## 2013-02-05 DIAGNOSIS — IMO0001 Reserved for inherently not codable concepts without codable children: Secondary | ICD-10-CM | POA: Insufficient documentation

## 2015-11-02 ENCOUNTER — Emergency Department (HOSPITAL_BASED_OUTPATIENT_CLINIC_OR_DEPARTMENT_OTHER): Payer: Medicaid Other

## 2015-11-02 ENCOUNTER — Encounter (HOSPITAL_BASED_OUTPATIENT_CLINIC_OR_DEPARTMENT_OTHER): Payer: Self-pay | Admitting: Emergency Medicine

## 2015-11-02 ENCOUNTER — Emergency Department (HOSPITAL_BASED_OUTPATIENT_CLINIC_OR_DEPARTMENT_OTHER)
Admission: EM | Admit: 2015-11-02 | Discharge: 2015-11-02 | Disposition: A | Discharge status: Home / Self Care | Payer: Medicaid Other | Attending: Emergency Medicine | Admitting: Emergency Medicine

## 2015-11-02 DIAGNOSIS — Z87891 Personal history of nicotine dependence: Secondary | ICD-10-CM | POA: Insufficient documentation

## 2015-11-02 DIAGNOSIS — E785 Hyperlipidemia, unspecified: Secondary | ICD-10-CM | POA: Insufficient documentation

## 2015-11-02 DIAGNOSIS — Z79899 Other long term (current) drug therapy: Secondary | ICD-10-CM | POA: Insufficient documentation

## 2015-11-02 DIAGNOSIS — N200 Calculus of kidney: Secondary | ICD-10-CM | POA: Diagnosis not present

## 2015-11-02 DIAGNOSIS — R1031 Right lower quadrant pain: Secondary | ICD-10-CM | POA: Diagnosis present

## 2015-11-02 LAB — CBC WITH DIFFERENTIAL/PLATELET
Basophils Absolute: 0 10*3/uL (ref 0.0–0.1)
Basophils Relative: 0 %
Eosinophils Absolute: 0 10*3/uL (ref 0.0–0.7)
Eosinophils Relative: 0 %
HCT: 39.4 % (ref 39.0–52.0)
Hemoglobin: 12.9 g/dL — ABNORMAL LOW (ref 13.0–17.0)
Lymphocytes Relative: 13 %
Lymphs Abs: 1.5 10*3/uL (ref 0.7–4.0)
MCH: 29.4 pg (ref 26.0–34.0)
MCHC: 32.7 g/dL (ref 30.0–36.0)
MCV: 89.7 fL (ref 78.0–100.0)
Monocytes Absolute: 0.7 10*3/uL (ref 0.1–1.0)
Monocytes Relative: 6 %
Neutro Abs: 9.6 10*3/uL — ABNORMAL HIGH (ref 1.7–7.7)
Neutrophils Relative %: 81 %
Platelets: 304 10*3/uL (ref 150–400)
RBC: 4.39 MIL/uL (ref 4.22–5.81)
RDW: 12.9 % (ref 11.5–15.5)
WBC: 11.9 10*3/uL — ABNORMAL HIGH (ref 4.0–10.5)

## 2015-11-02 LAB — URINALYSIS, ROUTINE W REFLEX MICROSCOPIC
BILIRUBIN URINE: NEGATIVE
Glucose, UA: NEGATIVE mg/dL
KETONES UR: NEGATIVE mg/dL
Leukocytes, UA: NEGATIVE
NITRITE: NEGATIVE
Protein, ur: NEGATIVE mg/dL
Specific Gravity, Urine: 1.025 (ref 1.005–1.030)
pH: 6 (ref 5.0–8.0)

## 2015-11-02 LAB — URINE MICROSCOPIC-ADD ON

## 2015-11-02 LAB — BASIC METABOLIC PANEL
Anion gap: 10 (ref 5–15)
BUN: 18 mg/dL (ref 6–20)
CO2: 30 mmol/L (ref 22–32)
Calcium: 9.3 mg/dL (ref 8.9–10.3)
Chloride: 100 mmol/L — ABNORMAL LOW (ref 101–111)
Creatinine, Ser: 1.17 mg/dL (ref 0.61–1.24)
GFR calc Af Amer: >60 mL/min (ref >60)
GFR calc non Af Amer: >60 mL/min (ref >60)
Glucose, Bld: 99 mg/dL (ref 65–99)
Potassium: 3.2 mmol/L — ABNORMAL LOW (ref 3.5–5.1)
Sodium: 140 mmol/L (ref 135–145)

## 2015-11-02 MED ORDER — KETOROLAC TROMETHAMINE 30 MG/ML IJ SOLN
30.0000 mg | Freq: Once | INTRAMUSCULAR | Status: AC
  Administered 2015-11-02: 30 mg via INTRAVENOUS
  Filled 2015-11-02: qty 1

## 2015-11-02 MED ORDER — ONDANSETRON HCL 4 MG PO TABS: 4.0000 mg | ORAL_TABLET | Freq: Four times a day (QID) | ORAL | Status: DC

## 2015-11-02 MED ORDER — SODIUM CHLORIDE 0.9 % IV BOLUS (SEPSIS)
1000.0000 mL | Freq: Once | INTRAVENOUS | Status: AC
  Administered 2015-11-02: 1000 mL via INTRAVENOUS

## 2015-11-02 MED ORDER — TAMSULOSIN HCL 0.4 MG PO CAPS: 0.4000 mg | ORAL_CAPSULE | Freq: Every day | ORAL | Status: DC

## 2015-11-02 MED FILL — TAMSULOSIN HCL 0.4 MG CAP: 0.4 | 30 days supply | Qty: 30 | Fill #0

## 2015-11-02 MED FILL — ONDANSETRON HCL 4 MG TABLET: 4 | 3 days supply | Qty: 12 | Fill #0

## 2015-11-02 NOTE — ED Provider Notes (Signed)
CSN: [id number]     Arrival date & time 11/02/15  1019 History   First MD Initiated Contact with Patient 11/02/15 1039     Chief Complaint  Patient presents with  . Flank Pain     (Consider location/radiation/quality/duration/timing/severity/associated sxs/prior Treatment) HPI Comments: Patient's is a 45 year old male history of chronic back pain who presents with a sudden onset right flank, right lower quadrant and periumbilical pain that began around 2 AM this morning. Patient has had associated decreased urination. Patient last urinated at 7 AM and described it as "dribbles." Patient has not been able to urinate since. Patient reports increased bowel movements as well, however denies diarrhea. Patient states he normally has 2-3 bowel movements per month, however patient had 5-6 urges to use the bathroom this morning. Patient describes his pain but he was "punched"in his right lower quadrant. He has not been able to get comfortable. Patient has had no associated nausea, vomiting. Patient patient also denies fevers, chest pain, shortness of breath. Patient has no history of kidney stones, however he has a strong family history. Patient has not taken any medication for his pain prior to arrival.  Patient is a 45 y.o. male presenting with flank pain. The history is provided by the patient.  Flank Pain Associated symptoms include abdominal pain. Pertinent negatives include no chest pain, chills, fever, headaches, nausea, rash, sore throat or vomiting.    Past Medical History  Diagnosis Date  . Hyperlipemia   . Back injury   . Sciatic pain    Past Surgical History  Procedure Laterality Date  . Back surgery    . Lung surgery    . Lung surgery      atalectasis   Family History  Problem Relation Age of Onset  . Diabetes Mother   . Hypertension Other    Social History  Substance Use Topics  . Smoking status: Former Smoker -- 1.00 packs/day for 25 years    Types: Cigarettes  .  Smokeless tobacco: Never Used  . Alcohol Use: No    Review of Systems  Constitutional: Negative for fever and chills.  HENT: Negative for facial swelling and sore throat.   Respiratory: Negative for shortness of breath.   Cardiovascular: Negative for chest pain.  Gastrointestinal: Positive for abdominal pain. Negative for nausea and vomiting.  Genitourinary: Positive for flank pain, decreased urine volume and difficulty urinating.  Musculoskeletal: Negative for back pain.  Skin: Negative for rash and wound.  Neurological: Negative for headaches.  Psychiatric/Behavioral: The patient is not nervous/anxious.       Allergies  Tramadol; Codeine; and Penicillins  Home Medications   Prior to Admission medications   Medication Sig Start Date End Date Taking? Authorizing Provider  ezetimibe (ZETIA) 10 MG tablet Take 10 mg by mouth daily.   Yes Historical Provider, MD  gabapentin (NEURONTIN) 600 MG tablet Take 600 mg by mouth 3 (three) times daily.     Yes Historical Provider, MD  ibuprofen (ADVIL,MOTRIN) 800 MG tablet Take 800 mg by mouth every 8 (eight) hours as needed.   Yes Historical Provider, MD  AMITRIPTYLINE HCL PO Take by mouth.    Historical Provider, MD  famotidine (PEPCID) 20 MG tablet Take 1 tablet (20 mg total) by mouth 2 (two) times daily as needed. 01/05/11 01/05/12  Sunnie Nielsen, MD  ondansetron (ZOFRAN) 4 MG tablet Take 1 tablet (4 mg total) by mouth every 6 (six) hours. 11/02/15   Emi Holes, PA-C  oxyCODONE-acetaminophen (PERCOCET/ROXICET) 5-325 MG  per tablet Take 1 tablet by mouth every 4 (four) hours as needed for pain.    Historical Provider, MD  tamsulosin (FLOMAX) 0.4 MG CAPS capsule Take 1 capsule (0.4 mg total) by mouth daily after supper. 11/02/15   Kimyatta Lecy M Pericles Carmicheal, PA-C   BP 140/82 mmHg  Pulse 77  Temp(Src) 98.1 F (36.7 C) (Oral)  Resp 16  Ht 6' (1.829 m)  Wt 78.472 kg  BMI 23.46 kg/m2  SpO2 100% Physical Exam  Constitutional: He appears well-developed  and well-nourished. No distress.  HENT:  Head: Normocephalic and atraumatic.  Mouth/Throat: Oropharynx is clear and moist. No oropharyngeal exudate.  Eyes: Conjunctivae are normal. Pupils are equal, round, and reactive to light. Right eye exhibits no discharge. Left eye exhibits no discharge. No scleral icterus.  Neck: Normal range of motion. Neck supple. No thyromegaly present.  Cardiovascular: Normal rate, regular rhythm, normal heart sounds and intact distal pulses.  Exam reveals no gallop and no friction rub.   No murmur heard. Pulmonary/Chest: Effort normal and breath sounds normal. No stridor. No respiratory distress. He has no wheezes. He has no rales.  Abdominal: Soft. Bowel sounds are normal. He exhibits no distension. There is tenderness in the right lower quadrant and periumbilical area. There is CVA tenderness (R). There is no rebound and no guarding.    Musculoskeletal: He exhibits no edema.  Lymphadenopathy:    He has no cervical adenopathy.  Neurological: He is alert. Coordination normal.  Skin: Skin is warm and dry. No rash noted. He is not diaphoretic. No pallor.  Psychiatric: He has a normal mood and affect.  Nursing note and vitals reviewed.   ED Course  Procedures (including critical care time) Labs Review Labs Reviewed  URINALYSIS, ROUTINE W REFLEX MICROSCOPIC (NOT AT [facility]) - Abnormal; Notable for the following:    Hgb urine dipstick TRACE (*)    All other components within normal limits  BASIC METABOLIC PANEL - Abnormal; Notable for the following:    Potassium 3.2 (*)    Chloride 100 (*)    All other components within normal limits  CBC WITH DIFFERENTIAL/PLATELET - Abnormal; Notable for the following:    WBC 11.9 (*)    Hemoglobin 12.9 (*)    Neutro Abs 9.6 (*)    All other components within normal limits  URINE MICROSCOPIC-ADD ON - Abnormal; Notable for the following:    Squamous Epithelial / LPF 0-5 (*)    Bacteria, UA FEW (*)    All other components  within normal limits    Imaging Review Ct Renal Stone Study  11/02/2015  CLINICAL DATA:  Right flank pain radiating into the right lower quadrant groin. Difficulty urinating. No known injury. EXAM: CT ABDOMEN AND PELVIS WITHOUT CONTRAST TECHNIQUE: Multidetector CT imaging of the abdomen and pelvis was performed following the standard protocol without IV contrast. COMPARISON:  None. FINDINGS: The lung bases are clear.  No pleural or pericardial effusion. There is mild to moderate right hydronephrosis with extensive stranding about the right kidney and ureter due to a 0.3 cm stone at the right UVJ. The patient also has a punctate nonobstructing stone in the upper pole of the right kidney. Approximately 4 left renal stones are identified measuring up to 0.3 cm in diameter. The seminal vesicles and prostate gland are unremarkable. The gallbladder, liver, spleen, adrenal glands and pancreas appear normal. The stomach, small and large bowel and appendix appear normal. No lymphadenopathy or fluid. Aortoiliac atherosclerosis without aneurysm is noted. The patient  is status post L5-S1 fusion. No lytic or sclerotic bony lesion is identified. IMPRESSION: Mild to moderate right hydronephrosis due to a 0.3 cm stone at the right UVJ. Single punctate nonobstructing right renal stone and 4 small left renal stones are also seen. Atherosclerosis. Electronically Signed   By: Drusilla Kannerhomas  Dalessio M.D.   On: 11/02/2015 11:36   I have personally reviewed and evaluated these images and lab results as part of my medical decision-making.   EKG Interpretation None      MDM   Pt has been diagnosed with a Kidney Stone via CT: Mild to moderate right hydronephrosis due to a 0.3 cm stone at the right UVJ; single punctate nonobstructing right renal stone and 4 small left renal stones are also seen. CBC shows WBC 11.9, hemoglobin 12.9. UA shows trace hematuria, few bacteria. Serum creatine WNL, vitals sign stable and the pt does not  have irratractable vomiting.  Pt will be dc home with Flomax, Zofran & has been advised to follow up with urology. Patient has Percocet at home that he takes for his back pain. I also sent patient home with a urine strainer. Patient understands and is in agreement with plan. Patient vitals stable throughout ED course and discharged in satisfactory condition. I discussed patient with Dr. Verdie MosherLiu who is in agreement with plan.   Final diagnoses:  Nephrolithiasis        Emi Holeslexandra M Navpreet Szczygiel, PA-C 11/02/15 1729  Lavera Guiseana Duo Liu, MD 11/02/15 [phone]

## 2015-11-02 NOTE — ED Notes (Signed)
PA at bedside.

## 2015-11-02 NOTE — Discharge Instructions (Signed)
Medications: Flomax, Zofran  Treatment: Take Flomax daily before bed to help speed up the passage of your kidney stone. Take Zofran every 6 hours as needed for nausea and vomiting. Take your at home pain medication as it is prescribed. Drink plenty of water. Decrease the amount of sugary drinks you're drinking.  Follow-up: Please follow-up with a urologist (the one you saw previously or the one outlined in discharge paperwork) within one week for further evaluation and treatment of your kidney stones. Please return to the emergency department if you develop any new or worsening symptoms.   Kidney Stones Kidney stones (urolithiasis) are deposits that form inside your kidneys. The intense pain is caused by the stone moving through the urinary tract. When the stone moves, the ureter goes into spasm around the stone. The stone is usually passed in the urine.  CAUSES   A disorder that makes certain neck glands produce too much parathyroid hormone (primary hyperparathyroidism).  A buildup of uric acid crystals, similar to gout in your joints.  Narrowing (stricture) of the ureter.  A kidney obstruction present at birth (congenital obstruction).  Previous surgery on the kidney or ureters.  Numerous kidney infections. SYMPTOMS   Feeling sick to your stomach (nauseous).  Throwing up (vomiting).  Blood in the urine (hematuria).  Pain that usually spreads (radiates) to the groin.  Frequency or urgency of urination. DIAGNOSIS   Taking a history and physical exam.  Blood or urine tests.  CT scan.  Occasionally, an examination of the inside of the urinary bladder (cystoscopy) is performed. TREATMENT   Observation.  Increasing your fluid intake.  Extracorporeal shock wave lithotripsy--This is a noninvasive procedure that uses shock waves to break up kidney stones.  Surgery may be needed if you have severe pain or persistent obstruction. There are various surgical procedures. Most of  the procedures are performed with the use of small instruments. Only small incisions are needed to accommodate these instruments, so recovery time is minimized. The size, location, and chemical composition are all important variables that will determine the proper choice of action for you. Talk to your health care provider to better understand your situation so that you will minimize the risk of injury to yourself and your kidney.  HOME CARE INSTRUCTIONS   Drink enough water and fluids to keep your urine clear or pale yellow. This will help you to pass the stone or stone fragments.  Strain all urine through the provided strainer. Keep all particulate matter and stones for your health care provider to see. The stone causing the pain may be as small as a grain of salt. It is very important to use the strainer each and every time you pass your urine. The collection of your stone will allow your health care provider to analyze it and verify that a stone has actually passed. The stone analysis will often identify what you can do to reduce the incidence of recurrences.  Only take over-the-counter or prescription medicines for pain, discomfort, or fever as directed by your health care provider.  Keep all follow-up visits as told by your health care provider. This is important.  Get follow-up X-rays if required. The absence of pain does not always mean that the stone has passed. It may have only stopped moving. If the urine remains completely obstructed, it can cause loss of kidney function or even complete destruction of the kidney. It is your responsibility to make sure X-rays and follow-ups are completed. Ultrasounds of the kidney can  show blockages and the status of the kidney. Ultrasounds are not associated with any radiation and can be performed easily in a matter of minutes.  Make changes to your daily diet as told by your health care provider. You may be told to:  Limit the amount of salt that you  eat.  Eat 5 or more servings of fruits and vegetables each day.  Limit the amount of meat, poultry, fish, and eggs that you eat.  Collect a 24-hour urine sample as told by your health care provider.You may need to collect another urine sample every 6-12 months. SEEK MEDICAL CARE IF:  You experience pain that is progressive and unresponsive to any pain medicine you have been prescribed. SEEK IMMEDIATE MEDICAL CARE IF:   Pain cannot be controlled with the prescribed medicine.  You have a fever or shaking chills.  The severity or intensity of pain increases over 18 hours and is not relieved by pain medicine.  You develop a new onset of abdominal pain.  You feel faint or pass out.  You are unable to urinate.   This information is not intended to replace advice given to you by your health care provider. Make sure you discuss any questions you have with your health care provider.   Document Released: 05/01/2005 Document Revised: 01/20/2015 Document Reviewed: 10/02/2012 Elsevier Interactive Patient Education 2016 Elsevier Inc.  Dietary Guidelines to Help Prevent Kidney Stones Your risk of kidney stones can be decreased by adjusting the foods you eat. The most important thing you can do is drink enough fluid. You should drink enough fluid to keep your urine clear or pale yellow. The following guidelines provide specific information for the type of kidney stone you have had. GUIDELINES ACCORDING TO TYPE OF KIDNEY STONE Calcium Oxalate Kidney Stones  Reduce the amount of salt you eat. Foods that have a lot of salt cause your body to release excess calcium into your urine. The excess calcium can combine with a substance called oxalate to form kidney stones.  Reduce the amount of animal protein you eat if the amount you eat is excessive. Animal protein causes your body to release excess calcium into your urine. Ask your dietitian how much protein from animal sources you should be  eating.  Avoid foods that are high in oxalates. If you take vitamins, they should have less than 500 mg of vitamin C. Your body turns vitamin C into oxalates. You do not need to avoid fruits and vegetables high in vitamin C. Calcium Phosphate Kidney Stones  Reduce the amount of salt you eat to help prevent the release of excess calcium into your urine.  Reduce the amount of animal protein you eat if the amount you eat is excessive. Animal protein causes your body to release excess calcium into your urine. Ask your dietitian how much protein from animal sources you should be eating.  Get enough calcium from food or take a calcium supplement (ask your dietitian for recommendations). Food sources of calcium that do not increase your risk of kidney stones include:  Broccoli.  Dairy products, such as cheese and yogurt.  Pudding. Uric Acid Kidney Stones  Do not have more than 6 oz of animal protein per day. FOOD SOURCES Animal Protein Sources  Meat (all types).  Poultry.  Eggs.  Fish, seafood. Foods High in [employer] seasonings.  Soy sauce.  Teriyaki sauce.  Cured and processed meats.  Salted crackers and snack foods.  Fast food.  Canned soups and  most canned foods. Foods High in Oxalates  Grains:  Amaranth.  Barley.  Grits.  Wheat germ.  Bran.  Buckwheat flour.  All bran cereals.  Pretzels.  Whole wheat bread.  Vegetables:  Beans (wax).  Beets and beet greens.  Collard greens.  Eggplant.  Escarole.  Leeks.  Okra.  Parsley.  Rutabagas.  Spinach.  Swiss chard.  Tomato paste.  Fried potatoes.  Sweet potatoes.  Fruits:  Red currants.  Figs.  Kiwi.  Rhubarb.  Meat and Other Protein Sources:  Beans (dried).  Soy burgers and other soybean products.  Miso.  Nuts (peanuts, almonds, pecans, cashews, hazelnuts).  Nut butters.  Sesame seeds and tahini (paste made of sesame seeds).  Poppy  seeds.  Beverages:  Chocolate drink mixes.  Soy milk.  Instant iced tea.  Juices made from high-oxalate fruits or vegetables.  Other:  Carob.  Chocolate.  Fruitcake.  Marmalades.   This information is not intended to replace advice given to you by your health care provider. Make sure you discuss any questions you have with your health care provider.   Document Released: 08/26/2010 Document Revised: 05/06/2013 Document Reviewed: 03/28/2013 Elsevier Interactive Patient Education [employer]

## 2015-11-02 NOTE — ED Notes (Signed)
Pt c/o R flank pain radiating into his RLQ and groin. Pt c/o difficulty urinating. Pt denies N/V.

## 2016-11-29 ENCOUNTER — Encounter: Payer: Self-pay | Admitting: *Deleted

## 2016-11-29 ENCOUNTER — Other Ambulatory Visit: Payer: Self-pay | Admitting: Radiation Oncology

## 2016-11-29 DIAGNOSIS — K209 Esophagitis, unspecified without bleeding: Secondary | ICD-10-CM

## 2016-11-29 MED ORDER — LIDOCAINE VISCOUS 2 % MT SOLN: 20.0000 mL | Freq: Four times a day (QID) | OROMUCOSAL | 5 refills | Status: DC | PRN

## 2016-11-29 MED ORDER — OXYCODONE HCL 5 MG PO TABS: 5.0000 mg | ORAL_TABLET | ORAL | 0 refills | Status: DC | PRN

## 2018-11-06 ENCOUNTER — Other Ambulatory Visit: Payer: Self-pay

## 2018-11-06 ENCOUNTER — Ambulatory Visit (INDEPENDENT_AMBULATORY_CARE_PROVIDER_SITE_OTHER): Payer: Medicaid Other

## 2018-11-06 ENCOUNTER — Ambulatory Visit
Admission: EM | Admit: 2018-11-06 | Discharge: 2018-11-06 | Disposition: A | Discharge status: Home / Self Care | Payer: Medicaid Other

## 2018-11-06 DIAGNOSIS — M79672 Pain in left foot: Secondary | ICD-10-CM

## 2018-11-06 MED ORDER — MELOXICAM 7.5 MG PO TABS: 7.5000 mg | ORAL_TABLET | Freq: Every day | ORAL | 0 refills | Status: DC

## 2018-11-06 NOTE — Discharge Instructions (Signed)
Your xray does not demonstrate any fractures or any broken bones today.  Likely bruising, may have some strain to the region as well.  Ice, elevation to help with pain.  Meloxicam to help with pain as well, take with food.  Please follow up with podiatry as needed if persistent foot pain

## 2018-11-06 NOTE — ED Triage Notes (Signed)
Pt states stepped on a rock a wk ago, having pain and swelling to lt foot

## 2018-11-06 NOTE — ED Provider Notes (Signed)
[facility]    CSN: [id number] Arrival date & time: 11/06/18  1006     History   Chief Complaint Chief Complaint  Patient presents with  . Foot Injury    HPI James Dudley is a 48 y.o. male.   James Dudley presents with complaints of left foot pain which started 6/18. States he had stepped on a rock which triggered the pain. Pain worsens throughout the day with weight bearing. Tripped and fell yesterday and has since had pain to the lateral foot. States he has broken this area of the foot in the past. No new numbness or tingling. Pain to left foot great toe MTP joint. No redness swelling or warmth. Took an oxycodone, which he typically takes for back pain, this morning. This did not help with his pain. Pain 7/10 in severity. Hx of back and sciatic pain.    ROS per HPI, negative if not otherwise mentioned.      Past Medical History:  Diagnosis Date  . Back injury   . Hyperlipemia   . Sciatic pain     There are no active problems to display for this patient.   Past Surgical History:  Procedure Laterality Date  . BACK SURGERY    . LUNG SURGERY    . LUNG SURGERY     atalectasis       Home Medications    Prior to Admission medications   Medication Sig Start Date End Date Taking? Authorizing Provider  docusate sodium (COLACE) 100 MG capsule Take 100 mg by mouth 2 (two) times daily.   Yes [provider]  simvastatin (ZOCOR) 20 MG tablet Take 20 mg by mouth daily.   Yes [provider]  AMITRIPTYLINE HCL PO Take by mouth.    [provider]  ezetimibe (ZETIA) 10 MG tablet Take 10 mg by mouth daily.    [provider]  famotidine (PEPCID) 20 MG tablet Take 1 tablet (20 mg total) by mouth 2 (two) times daily as needed. 01/05/11 01/05/12  Sunnie Nielsenpitz, Brian, MD  gabapentin (NEURONTIN) 600 MG tablet Take 600 mg by mouth 3 (three) times daily.      [provider]  ibuprofen (ADVIL,MOTRIN) 800 MG tablet Take 800 mg  by mouth every 8 (eight) hours as needed.    [provider]  meloxicam (MOBIC) 7.5 MG tablet Take 1 tablet (7.5 mg total) by mouth daily. 11/06/18   Georgetta HaberBurky,  B, NP  oxyCODONE-acetaminophen (PERCOCET/ROXICET) 5-325 MG per tablet Take 1 tablet by mouth every 4 (four) hours as needed for pain.    [provider]    Family History Family History  Problem Relation Age of Onset  . Diabetes Mother   . Hypertension Other     Social History Social History   Tobacco Use  . Smoking status: Former Smoker    Packs/day: 1.00    Years: 25.00    Pack years: 25.00    Types: Cigarettes  . Smokeless tobacco: Never Used  Substance Use Topics  . Alcohol use: No  . Drug use: No     Allergies   Tramadol, Codeine, and Penicillins   Review of Systems Review of Systems   Physical Exam Triage Vital Signs ED Triage Vitals [11/06/18 1017]  Enc Vitals Group     BP 127/88     Pulse Rate (!) 111     Resp 20     Temp 97.8 F (36.6 C)     Temp Source  Oral     SpO2 95 %     Weight      Height      Head Circumference      Peak Flow      Pain Score 7     Pain Loc      Pain Edu?      Excl. in GC?    No data found.  Updated Vital Signs BP 127/88 (BP Location: Left Arm)   Pulse (!) 111   Temp 97.8 F (36.6 C) (Oral)   Resp 20   SpO2 95%    Physical Exam Constitutional:      Appearance: He is well-developed.  Cardiovascular:     Rate and Rhythm: Tachycardia present.  Pulmonary:     Effort: Pulmonary effort is normal.  Musculoskeletal:     Left ankle: Normal.     Left foot: Normal range of motion and normal capillary refill. Tenderness and bony tenderness present. No swelling, crepitus or deformity.       Feet:     Comments: Pain to ventral aspect of left foot proximal to great toe MTP joint; no redness or warmth, skin intact; full ROM of ankle; gross sensation intact to toes; cap refill < 2 seconds ; strong pedal pulse  Skin:    General: Skin is warm  and dry.  Neurological:     Mental Status: He is alert and oriented to person, place, and time.      UC Treatments / Results  Labs (all labs ordered are listed, but only abnormal results are displayed) Labs Reviewed - No data to display  EKG None  Radiology Dg Foot Complete Left  Result Date: 11/06/2018 CLINICAL DATA:  Left foot pain since the patient stepped on a rock 1 week ago. Initial encounter. EXAM: LEFT FOOT - COMPLETE 3+ VIEW COMPARISON:  None. FINDINGS: There is no evidence of fracture or dislocation. There is no evidence of arthropathy or other focal bone abnormality. Soft tissues are unremarkable. IMPRESSION: Negative exam. Electronically Signed   By: Drusilla Kannerhomas  Dalessio M.D.   On: 11/06/2018 10:59    Procedures Procedures (including critical care time)  Medications Ordered in UC Medications - No data to display  Initial Impression / Assessment and Plan / UC Course  I have reviewed the triage vital signs and the nursing notes.  Pertinent labs & imaging results that were available during my care of the patient were reviewed by me and considered in my medical decision making (see chart for details).     Xray WNL here today. Pain management discussed. Encouraged follow up with podiatry for persistent symptoms. Patient verbalized understanding and agreeable to plan.  Ambulatory out of clinic without difficulty with cane.   Final Clinical Impressions(s) / UC Diagnoses   Final diagnoses:  Acute foot pain, left     Discharge Instructions     Your xray does not demonstrate any fractures or any broken bones today.  Likely bruising, may have some strain to the region as well.  Ice, elevation to help with pain.  Meloxicam to help with pain as well, take with food.  Please follow up with podiatry as needed if persistent foot pain     ED Prescriptions    Medication Sig Dispense Auth. Provider   meloxicam (MOBIC) 7.5 MG tablet Take 1 tablet (7.5 mg total) by mouth  daily. 20 tablet Georgetta HaberBurky,  B, NP     Controlled Substance Prescriptions [place] Controlled Substance Registry consulted? Not Applicable  Zigmund Gottron, NP 11/06/18 1114

## 2019-09-12 ENCOUNTER — Emergency Department (HOSPITAL_COMMUNITY)
Admission: EM | Admit: 2019-09-12 | Discharge: 2019-09-12 | Disposition: A | Discharge status: Home / Self Care | Payer: Medicaid Other | Attending: Emergency Medicine | Admitting: Emergency Medicine

## 2019-09-12 ENCOUNTER — Other Ambulatory Visit: Payer: Self-pay

## 2019-09-12 ENCOUNTER — Encounter (HOSPITAL_COMMUNITY): Payer: Self-pay

## 2019-09-12 ENCOUNTER — Emergency Department (HOSPITAL_COMMUNITY): Payer: Medicaid Other

## 2019-09-12 DIAGNOSIS — Y93I9 Activity, other involving external motion: Secondary | ICD-10-CM | POA: Insufficient documentation

## 2019-09-12 DIAGNOSIS — S161XXA Strain of muscle, fascia and tendon at neck level, initial encounter: Secondary | ICD-10-CM | POA: Diagnosis not present

## 2019-09-12 DIAGNOSIS — Y999 Unspecified external cause status: Secondary | ICD-10-CM | POA: Diagnosis not present

## 2019-09-12 DIAGNOSIS — Z79899 Other long term (current) drug therapy: Secondary | ICD-10-CM | POA: Insufficient documentation

## 2019-09-12 DIAGNOSIS — Z87891 Personal history of nicotine dependence: Secondary | ICD-10-CM | POA: Insufficient documentation

## 2019-09-12 DIAGNOSIS — Y92410 Unspecified street and highway as the place of occurrence of the external cause: Secondary | ICD-10-CM | POA: Diagnosis not present

## 2019-09-12 DIAGNOSIS — S199XXA Unspecified injury of neck, initial encounter: Secondary | ICD-10-CM | POA: Diagnosis present

## 2019-09-12 HISTORY — DX: Other pulmonary collapse: J98.19
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
## 2019-09-12 NOTE — ED Notes (Signed)
An After Visit Summary was printed and given to the patient. Discharge instructions given and no further questions at this time.  
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
## 2019-11-07 ENCOUNTER — Other Ambulatory Visit: Payer: Self-pay | Admitting: Physical Medicine and Rehabilitation

## 2019-11-07 DIAGNOSIS — M545 Low back pain, unspecified: Secondary | ICD-10-CM

## 2019-11-14 ENCOUNTER — Other Ambulatory Visit: Payer: Self-pay | Admitting: Physical Medicine and Rehabilitation

## 2019-11-20 ENCOUNTER — Other Ambulatory Visit: Payer: Medicaid Other

## 2019-12-14 ENCOUNTER — Other Ambulatory Visit: Payer: Self-pay

## 2019-12-14 ENCOUNTER — Ambulatory Visit
Admission: RE | Admit: 2019-12-14 | Discharge: 2019-12-14 | Disposition: A | Discharge status: Home / Self Care | Payer: Medicaid Other | Source: Ambulatory Visit | Attending: Physical Medicine and Rehabilitation | Admitting: Physical Medicine and Rehabilitation

## 2019-12-14 DIAGNOSIS — M545 Low back pain, unspecified: Secondary | ICD-10-CM

## 2021-03-27 NOTE — Progress Notes (Signed)
Cardiology Office Note:    Date:  03/28/2021   ID:  James Dudley, DOB July 30, 1970, MRN [id number]  PCP:  [facility]  Cardiologist:  None  Electrophysiologist:  None   Referring MD: Lonie Peak, PA-C   Chief Complaint  Patient presents with   New Patient (Initial Visit)        Shortness of Breath    History of Present Illness:    James Dudley is a 50 y.o. male with a hx of hyperlipidemia who is referred by Lonie Peak, PA for evaluation of shortness of breath and chest pain.  He reports he has been having shortness of breath for the past year.  States that he is short of breath with minimal exertion.  Also has chest pain about once per month.  Describes as sharp pain on left side of chest.  Does not notice what brings it on.  Also gets diaphoretic during episodes of shortness of breath and feels lightheaded, but no syncope.  Denies any palpitations or lower extremity edema.  He had COVID-19 infection in March 2022 and lost 30 pounds.  Has smoked 2 packs/day for over 30 years, quit at age 61.  Family history includes father has had 5 PCIs, first in late 40s/early 45s.   Past Medical History:  Diagnosis Date   Back injury    Collapsed lung    Hyperlipemia    Sciatic pain     Past Surgical History:  Procedure Laterality Date   BACK SURGERY     LUNG SURGERY     LUNG SURGERY     atalectasis    Current Medications: Current Meds  Medication Sig   AMITRIPTYLINE HCL PO Take 50-75 mg by mouth daily.   gabapentin (NEURONTIN) 600 MG tablet Take 600 mg by mouth daily.   metoprolol tartrate (LOPRESSOR) 100 MG tablet Take 100mg  (1 tablet) TWO hours prior to CT scan   simvastatin (ZOCOR) 20 MG tablet Take 20 mg by mouth daily.   [DISCONTINUED] docusate sodium (COLACE) 100 MG capsule Take 100 mg by mouth 2 (two) times daily.   [DISCONTINUED] ezetimibe (ZETIA) 10 MG tablet Take 10 mg by mouth daily.   [DISCONTINUED] ibuprofen (ADVIL,MOTRIN) 800 MG tablet  Take 800 mg by mouth every 8 (eight) hours as needed.   [DISCONTINUED] meloxicam (MOBIC) 7.5 MG tablet Take 1 tablet (7.5 mg total) by mouth daily.   [DISCONTINUED] oxyCODONE-acetaminophen (PERCOCET/ROXICET) 5-325 MG per tablet Take 1 tablet by mouth every 4 (four) hours as needed for pain.     Allergies:   Tramadol, Codeine, and Penicillins   Social History   Socioeconomic History   Marital status: Single    Spouse name: Not on file   Number of children: Not on file   Years of education: Not on file   Highest education level: Not on file  Occupational History   Not on file  Tobacco Use   Smoking status: Former    Packs/day: 1.00    Years: 25.00    Pack years: 25.00    Types: Cigarettes   Smokeless tobacco: Never  Substance and Sexual Activity   Alcohol use: No   Drug use: No   Sexual activity: Not on file  Other Topics Concern   Not on file  Social History Narrative   Not on file   Social Determinants of Health   Financial Resource Strain: Not on file  Food Insecurity: Not on file  Transportation Needs: Not on file  Physical  Activity: Not on file  Stress: Not on file  Social Connections: Not on file     Family History: The patient's family history includes Diabetes in his mother; Hypertension in an other family member.  ROS:   Please see the history of present illness.     All other systems reviewed and are negative.  EKGs/Labs/Other Studies Reviewed:    The following studies were reviewed today:   EKG:   03/28/21: normal sinus rhythm, rate 83, no ST abnormalities  Recent Labs: No results found for requested labs within last 8760 hours.  Recent Lipid Panel    Component Value Date/Time   CHOL 235 (H) 02/11/2010 2147   TRIG 215 (H) 02/11/2010 2147   HDL 42 02/11/2010 2147   CHOLHDL 5.6 Ratio 02/11/2010 2147   VLDL 43 (H) 02/11/2010 2147   LDLCALC 150 (H) 02/11/2010 2147    Physical Exam:    VS:  BP 132/90 (BP Location: Right Arm, Patient  Position: Sitting, Cuff Size: Normal)   Pulse 83   Ht 5\' 10"  (1.778 m)   Wt 170 lb (77.1 kg)   BMI 24.39 kg/m     Wt Readings from Last 3 Encounters:  03/28/21 170 lb (77.1 kg)  11/02/15 173 lb (78.5 kg)  01/05/11 115 lb (52.2 kg)     GEN:  Well nourished, well developed in no acute distress HEENT: Normal NECK: No JVD; No carotid bruits LYMPHATICS: No lymphadenopathy CARDIAC: RRR, no murmurs, rubs, gallops RESPIRATORY:  Clear to auscultation without rales, wheezing or rhonchi  ABDOMEN: Soft, non-tender, non-distended MUSCULOSKELETAL:  No edema; No deformity  SKIN: Warm and dry NEUROLOGIC:  Alert and oriented x 3 PSYCHIATRIC:  Normal affect   ASSESSMENT:    1. DOE (dyspnea on exertion)   2. Chest pain of uncertain etiology   3. Hyperlipidemia, unspecified hyperlipidemia type    PLAN:    Chest pain/exertional dyspnea: Chest pain is atypical in description but does have significant CAD risk factors (tobacco use, family history, hyperlipidemia).  Also having exertional dyspnea that could represent anginal equivalent. -Recommend coronary CTA to rule out obstructive CAD.  Will give Lopressor 100 mg prior to study -Echocardiogram to rule out structural heart disease  Hyperlipidemia: On simvastatin 20 mg daily.  LDL 127 on 01/19/2021.  Will follow-up results of coronary CTA to guide how aggressive to be in lowering cholesterol   RTC in 6 months   Medication Adjustments/Labs and Tests Ordered: Current medicines are reviewed at length with the patient today.  Concerns regarding medicines are outlined above.  Orders Placed This Encounter  Procedures   CT CORONARY MORPH W/CTA COR W/SCORE W/CA W/CM &/OR WO/CM   Basic metabolic panel   EKG 12-Lead   ECHOCARDIOGRAM COMPLETE    Meds ordered this encounter  Medications   metoprolol tartrate (LOPRESSOR) 100 MG tablet    Sig: Take 100mg  (1 tablet) TWO hours prior to CT scan    Dispense:  1 tablet    Refill:  0     Patient  Instructions  Medication Instructions:  Your physician recommends that you continue on your current medications as directed. Please refer to the Current Medication list given to you today.  *If you need a refill on your cardiac medications before your next appointment, please call your pharmacy*   Lab Work: BMET today  If you have labs (blood work) drawn today and your tests are completely normal, you will receive your results only by: MyChart Message (if you have MyChart) OR  A paper copy in the mail If you have any lab test that is abnormal or we need to change your treatment, we will call you to review the results.   Testing/Procedures: Coronary CTA-see instructions below  Your physician has requested that you have an echocardiogram. Echocardiography is a painless test that uses sound waves to create images of your heart. It provides your doctor with information about the size and shape of your heart and how well your heart's chambers and valves are working. This procedure takes approximately one hour. There are no restrictions for this procedure. This will be done at our [facility] location:  [address]  Follow-Up: At [facility], you and your health needs are our priority.  As part of our continuing mission to provide you with exceptional heart care, we have created designated Provider Care Teams.  These Care Teams include your primary Cardiologist (physician) and Advanced Practice Providers (APPs -  Physician Assistants and Nurse Practitioners) who all work together to provide you with the care you need, when you need it.  We recommend signing up for the patient portal called "MyChart".  Sign up information is provided on this After Visit Summary.  MyChart is used to connect with patients for Virtual Visits (Telemedicine).  Patients are able to view lab/test results, encounter notes, upcoming appointments, etc.  Non-urgent messages can be sent to your provider  as well.   To learn more about what you can do with MyChart, go to [email or web address].    Your next appointment:   6 month(s)  The format for your next appointment:   In Person  Provider:   Dr. Bjorn Pippin  Other Instructions   Your cardiac CT will be scheduled at one of the below locations:   [facility] [address] [place], [place] [address] [phone]  If scheduled at [facility], please arrive at the [facility] main entrance (entrance A) of [facility] 30 minutes prior to test start time. You can use the FREE valet parking offered at the main entrance (encouraged to control the heart rate for the test) Proceed to the [facility] Radiology Department (first floor) to check-in and test prep.  Please follow these instructions carefully (unless otherwise directed):  Hold all erectile dysfunction medications at least 3 days (72 hrs) prior to test.  On the Night Before the Test: Be sure to Drink plenty of water. Do not consume any caffeinated/decaffeinated beverages or chocolate 12 hours prior to your test. Do not take any antihistamines 12 hours prior to your test.  On the Day of the Test: Drink plenty of water until 1 hour prior to the test. Do not eat any food 4 hours prior to the test. You may take your regular medications prior to the test.  Take metoprolol (Lopressor) two hours prior to test.  After the Test: Drink plenty of water. After receiving IV contrast, you may experience a mild flushed feeling. This is normal. On occasion, you may experience a mild rash up to 24 hours after the test. This is not dangerous. If this occurs, you can take Benadryl 25 mg and increase your fluid intake. If you experience trouble breathing, this can be serious. If it is severe call 911 IMMEDIATELY. If it is mild, please call our office. If you take any of these medications: Glipizide/Metformin, Avandament, Glucavance, please do not take 48 hours  after completing test unless otherwise instructed.  Please allow 2-4 weeks for  scheduling of routine cardiac CTs. Some insurance companies require a pre-authorization which may delay scheduling of this test.   For non-scheduling related questions, please contact the cardiac imaging nurse navigator should you have any questions/concerns: Rockwell Alexandria, Cardiac Imaging Nurse Navigator Larey Brick, Cardiac Imaging Nurse Navigator [facility] Direct Office Dial: [phone]   For scheduling needs, including cancellations and rescheduling, please call [place], [phone].    Signed, Little Ishikawa, MD  03/28/2021 1:28 PM    [facility]

## 2021-03-28 ENCOUNTER — Encounter: Payer: Self-pay | Admitting: Cardiology

## 2021-03-28 ENCOUNTER — Other Ambulatory Visit: Payer: Self-pay

## 2021-03-28 ENCOUNTER — Ambulatory Visit: Payer: Medicaid Other | Admitting: Cardiology

## 2021-03-28 VITALS — BP 132/90 | HR 83 | Ht 70.0 in | Wt 170.0 lb

## 2021-03-28 DIAGNOSIS — R0609 Other forms of dyspnea: Secondary | ICD-10-CM | POA: Diagnosis not present

## 2021-03-28 DIAGNOSIS — R079 Chest pain, unspecified: Secondary | ICD-10-CM | POA: Diagnosis not present

## 2021-03-28 DIAGNOSIS — E785 Hyperlipidemia, unspecified: Secondary | ICD-10-CM

## 2021-03-28 MED ORDER — METOPROLOL TARTRATE 100 MG PO TABS: ORAL_TABLET | ORAL | 0 refills | Status: DC

## 2021-03-28 NOTE — Patient Instructions (Signed)
Medication Instructions:  Your physician recommends that you continue on your current medications as directed. Please refer to the Current Medication list given to you today.  *If you need a refill on your cardiac medications before your next appointment, please call your pharmacy*   Lab Work: BMET today  If you have labs (blood work) drawn today and your tests are completely normal, you will receive your results only by: MyChart Message (if you have MyChart) OR A paper copy in the mail If you have any lab test that is abnormal or we need to change your treatment, we will call you to review the results.   Testing/Procedures: Coronary CTA-see instructions below  Your physician has requested that you have an echocardiogram. Echocardiography is a painless test that uses sound waves to create images of your heart. It provides your doctor with information about the size and shape of your heart and how well your heart's chambers and valves are working. This procedure takes approximately one hour. There are no restrictions for this procedure. This will be done at our [facility] location:  [address]  Follow-Up: At [facility], you and your health needs are our priority.  As part of our continuing mission to provide you with exceptional heart care, we have created designated Provider Care Teams.  These Care Teams include your primary Cardiologist (physician) and Advanced Practice Providers (APPs -  Physician Assistants and Nurse Practitioners) who all work together to provide you with the care you need, when you need it.  We recommend signing up for the patient portal called "MyChart".  Sign up information is provided on this After Visit Summary.  MyChart is used to connect with patients for Virtual Visits (Telemedicine).  Patients are able to view lab/test results, encounter notes, upcoming appointments, etc.  Non-urgent messages can be sent to your provider as well.   To  learn more about what you can do with MyChart, go to [email or web address].    Your next appointment:   6 month(s)  The format for your next appointment:   In Person  Provider:   Dr. Bjorn Pippin  Other Instructions   Your cardiac CT will be scheduled at one of the below locations:   [facility] [address] [place], [place] [address] [phone]  If scheduled at [facility], please arrive at the [facility] main entrance (entrance A) of [facility] 30 minutes prior to test start time. You can use the FREE valet parking offered at the main entrance (encouraged to control the heart rate for the test) Proceed to the [facility] Radiology Department (first floor) to check-in and test prep.  Please follow these instructions carefully (unless otherwise directed):  Hold all erectile dysfunction medications at least 3 days (72 hrs) prior to test.  On the Night Before the Test: Be sure to Drink plenty of water. Do not consume any caffeinated/decaffeinated beverages or chocolate 12 hours prior to your test. Do not take any antihistamines 12 hours prior to your test.  On the Day of the Test: Drink plenty of water until 1 hour prior to the test. Do not eat any food 4 hours prior to the test. You may take your regular medications prior to the test.  Take metoprolol (Lopressor) two hours prior to test.  After the Test: Drink plenty of water. After receiving IV contrast, you may experience a mild flushed feeling. This is normal. On occasion, you may experience a mild  rash up to 24 hours after the test. This is not dangerous. If this occurs, you can take Benadryl 25 mg and increase your fluid intake. If you experience trouble breathing, this can be serious. If it is severe call 911 IMMEDIATELY. If it is mild, please call our office. If you take any of these medications: Glipizide/Metformin, Avandament, Glucavance, please do not take 48 hours after  completing test unless otherwise instructed.  Please allow 2-4 weeks for scheduling of routine cardiac CTs. Some insurance companies require a pre-authorization which may delay scheduling of this test.   For non-scheduling related questions, please contact the cardiac imaging nurse navigator should you have any questions/concerns: Rockwell Alexandria, Cardiac Imaging Nurse Navigator Larey Brick, Cardiac Imaging Nurse Navigator [facility] Direct Office Dial: [phone]   For scheduling needs, including cancellations and rescheduling, please call [place], [phone].

## 2021-03-29 LAB — BASIC METABOLIC PANEL
BUN/Creatinine Ratio: 11 (ref 9–20)
BUN: 10 mg/dL (ref 6–24)
CO2: 25 mmol/L (ref 20–29)
Calcium: 10.1 mg/dL (ref 8.7–10.2)
Chloride: 100 mmol/L (ref 96–106)
Creatinine, Ser: 0.91 mg/dL (ref 0.76–1.27)
Glucose: 76 mg/dL (ref 70–99)
Potassium: 4.6 mmol/L (ref 3.5–5.2)
Sodium: 142 mmol/L (ref 134–144)
eGFR: 101 mL/min/{1.73_m2} (ref >59)

## 2021-04-01 ENCOUNTER — Encounter: Payer: Self-pay | Admitting: *Deleted

## 2021-04-05 ENCOUNTER — Telehealth (HOSPITAL_COMMUNITY): Payer: Self-pay | Admitting: Emergency Medicine

## 2021-04-05 NOTE — Telephone Encounter (Signed)
Reaching out to patient to offer assistance regarding upcoming cardiac imaging study; pt verbalizes understanding of appt date/time, parking situation and where to check in, pre-test NPO status and medications ordered, and verified current allergies; name and call back number provided for further questions should they arise Rockwell Alexandria RN Navigator Cardiac Imaging [facility] [phone] office [phone] cell  Arrival 2p\ 100mg  metoprolol Denies iv issues

## 2021-04-06 ENCOUNTER — Ambulatory Visit (HOSPITAL_COMMUNITY)
Admission: RE | Admit: 2021-04-06 | Discharge: 2021-04-06 | Disposition: A | Discharge status: Home / Self Care | Payer: Medicaid Other | Source: Ambulatory Visit | Attending: Cardiology | Admitting: Cardiology

## 2021-04-06 ENCOUNTER — Other Ambulatory Visit: Payer: Self-pay

## 2021-04-06 DIAGNOSIS — R079 Chest pain, unspecified: Secondary | ICD-10-CM | POA: Insufficient documentation

## 2021-04-06 DIAGNOSIS — R0609 Other forms of dyspnea: Secondary | ICD-10-CM | POA: Diagnosis present

## 2021-04-06 MED ORDER — DILTIAZEM HCL 25 MG/5ML IV SOLN: 10.0000 mg | INTRAVENOUS | Status: DC | PRN

## 2021-04-06 MED ORDER — METOPROLOL TARTRATE 5 MG/5ML IV SOLN: 10.0000 mg | INTRAVENOUS | Status: DC | PRN

## 2021-04-06 MED ORDER — IOHEXOL 350 MG/ML SOLN
95.0000 mL | Freq: Once | INTRAVENOUS | Status: AC | PRN
  Administered 2021-04-06: 95 mL via INTRAVENOUS

## 2021-04-06 MED ORDER — NITROGLYCERIN 0.4 MG SL SUBL: 0.8000 mg | SUBLINGUAL_TABLET | Freq: Once | SUBLINGUAL | Status: AC

## 2021-04-06 MED ORDER — NITROGLYCERIN 0.4 MG SL SUBL
SUBLINGUAL_TABLET | SUBLINGUAL | Status: AC
  Administered 2021-04-06: 0.8 mg via SUBLINGUAL
  Filled 2021-04-06: qty 2

## 2021-04-15 ENCOUNTER — Other Ambulatory Visit: Payer: Self-pay | Admitting: *Deleted

## 2021-04-15 MED ORDER — ATORVASTATIN CALCIUM 40 MG PO TABS: 40.0000 mg | ORAL_TABLET | Freq: Every day | ORAL | 3 refills | Status: DC

## 2021-04-27 ENCOUNTER — Other Ambulatory Visit: Payer: Self-pay

## 2021-04-27 ENCOUNTER — Ambulatory Visit (HOSPITAL_COMMUNITY): Payer: Medicaid Other | Attending: Cardiology

## 2021-04-27 DIAGNOSIS — R079 Chest pain, unspecified: Secondary | ICD-10-CM | POA: Insufficient documentation

## 2021-04-27 DIAGNOSIS — R0609 Other forms of dyspnea: Secondary | ICD-10-CM | POA: Diagnosis not present

## 2021-04-27 LAB — ECHOCARDIOGRAM COMPLETE
Area-P 1/2: 7.16 cm2
S' Lateral: 3.1 cm

## 2021-05-01 NOTE — Progress Notes (Deleted)
Cardiology Office Note:    Date:  05/01/2021   ID:  [name], DOB 1970/11/07, MRN [id number]  PCP:  Practice, [facility]  Cardiologist:  None  Electrophysiologist:  None   Referring MD: Practice, Duanne Limerick*   No chief complaint on file.   History of Present Illness:    James Dudley is a 50 y.o. male with a hx of hyperlipidemia who presents for follow-up.  He was referred by Lonie Peak, PA for evaluation of shortness of breath and chest pain, initially seen on 03/28/2021.  He reports he has been having shortness of breath for the past year.  States that he is short of breath with minimal exertion.  Also has chest pain about once per month.  Describes as sharp pain on left side of chest.  Does not notice what brings it on.  Also gets diaphoretic during episodes of shortness of breath and feels lightheaded, but no syncope.  Denies any palpitations or lower extremity edema.  He had COVID-19 infection in March 2022 and lost 30 pounds.  Has smoked 2 packs/day for over 30 years, quit at age 43.  Family history includes father has had 5 PCIs, first in late 40s/early 81s.  Coronary CTA on 04/06/2021 showed mild nonobstructive CAD, calcium score 83 (83rd percentile).  Echo on 04/27/2021 showed EF 45 to 50%, grade 1 diastolic dysfunction, normal RV function, no significant valvular disease.  Since last clinic visit,   Past Medical History:  Diagnosis Date   Back injury    Collapsed lung    Hyperlipemia    Sciatic pain     Past Surgical History:  Procedure Laterality Date   BACK SURGERY     LUNG SURGERY     LUNG SURGERY     atalectasis    Current Medications: No outpatient medications have been marked as taking for the 05/04/21 encounter (Appointment) with Little Ishikawa, MD.     Allergies:   Tramadol, Codeine, and Penicillins   Social History   Socioeconomic History   Marital status: Single    Spouse name: Not on file   Number of children:  Not on file   Years of education: Not on file   Highest education level: Not on file  Occupational History   Not on file  Tobacco Use   Smoking status: Former    Packs/day: 1.00    Years: 25.00    Pack years: 25.00    Types: Cigarettes   Smokeless tobacco: Never  Substance and Sexual Activity   Alcohol use: No   Drug use: No   Sexual activity: Not on file  Other Topics Concern   Not on file  Social History Narrative   Not on file   Social Determinants of Health   Financial Resource Strain: Not on file  Food Insecurity: Not on file  Transportation Needs: Not on file  Physical Activity: Not on file  Stress: Not on file  Social Connections: Not on file     Family History: The patient's family history includes Diabetes in his mother; Hypertension in an other family member.  ROS:   Please see the history of present illness.     All other systems reviewed and are negative.  EKGs/Labs/Other Studies Reviewed:    The following studies were reviewed today:   EKG:   03/28/21: normal sinus rhythm, rate 83, no ST abnormalities  Recent Labs: 03/28/2021: BUN 10; Creatinine, Ser 0.91; Potassium 4.6; Sodium 142  Recent Lipid Panel  Component Value Date/Time   CHOL 235 (H) 02/11/2010 2147   TRIG 215 (H) 02/11/2010 2147   HDL 42 02/11/2010 2147   CHOLHDL 5.6 Ratio 02/11/2010 2147   VLDL 43 (H) 02/11/2010 2147   LDLCALC 150 (H) 02/11/2010 2147    Physical Exam:    VS:  There were no vitals taken for this visit.    Wt Readings from Last 3 Encounters:  03/28/21 170 lb (77.1 kg)  11/02/15 173 lb (78.5 kg)  01/05/11 115 lb (52.2 kg)     GEN:  Well nourished, well developed in no acute distress HEENT: Normal NECK: No JVD; No carotid bruits LYMPHATICS: No lymphadenopathy CARDIAC: RRR, no murmurs, rubs, gallops RESPIRATORY:  Clear to auscultation without rales, wheezing or rhonchi  ABDOMEN: Soft, non-tender, non-distended MUSCULOSKELETAL:  No edema; No deformity   SKIN: Warm and dry NEUROLOGIC:  Alert and oriented x 3 PSYCHIATRIC:  Normal affect   ASSESSMENT:    No diagnosis found.  PLAN:    Systolic dysfunction: EF 45 to 50% on echo 04/27/2021.  Nonobstructive CAD on coronary CTA 04/06/2021. -***Losartan, Toprol-XL  Chest pain/exertional dyspnea: Chest pain is atypical in description but does have significant CAD risk factors (tobacco use, family history, hyperlipidemia).  Coronary CTA on 04/06/2021 showed mild nonobstructive CAD, calcium score 83 (83rd percentile).  Echo on 04/27/2021 showed EF 45 to 50%, grade 1 diastolic dysfunction, normal RV function, no significant valvular disease.  Suspect systolic dysfunction contributing.  Hyperlipidemia: On simvastatin 20 mg daily.  LDL 127 on 01/19/2021.  Calcium score 83 (83rd percentile) on 04/06/2021.  Switched to atorvastatin 40 mg daily   RTC in***   Medication Adjustments/Labs and Tests Ordered: Current medicines are reviewed at length with the patient today.  Concerns regarding medicines are outlined above.  No orders of the defined types were placed in this encounter.   No orders of the defined types were placed in this encounter.    There are no Patient Instructions on file for this visit.   Signed, Little Ishikawa, MD  05/01/2021 10:56 PM    [facility]

## 2021-05-03 ENCOUNTER — Telehealth: Payer: Self-pay | Admitting: Cardiology

## 2021-05-03 NOTE — Telephone Encounter (Signed)
Spoke with patient of Dr. Bjorn Pippin. He has had about 3 days of GI upset/diarrhea. He would like to r/s his appt which is to f/u on echo. Moved appt from 12/21 to 12/23 @ 1020am

## 2021-05-03 NOTE — Telephone Encounter (Signed)
Pt has an upcoming appt 05/04/21 but pt wanted to r/s for next available with [facility] but mother refused the next available appt. For the patient. Pt has had diarrhea for the past 3 days and he does not want to proceed with his appt tomorrow. Pt wants to know from medical staff what he should do?? Please advise pt further

## 2021-05-04 ENCOUNTER — Ambulatory Visit: Payer: Medicaid Other | Admitting: Cardiology

## 2021-05-06 ENCOUNTER — Encounter: Payer: Self-pay | Admitting: Cardiology

## 2021-05-06 ENCOUNTER — Ambulatory Visit: Payer: Medicaid Other | Admitting: Cardiology

## 2021-05-06 ENCOUNTER — Other Ambulatory Visit: Payer: Self-pay

## 2021-05-06 VITALS — BP 126/72 | HR 78 | Ht 70.0 in | Wt 170.6 lb

## 2021-05-06 DIAGNOSIS — R079 Chest pain, unspecified: Secondary | ICD-10-CM

## 2021-05-06 DIAGNOSIS — R0609 Other forms of dyspnea: Secondary | ICD-10-CM

## 2021-05-06 DIAGNOSIS — E785 Hyperlipidemia, unspecified: Secondary | ICD-10-CM

## 2021-05-06 DIAGNOSIS — I429 Cardiomyopathy, unspecified: Secondary | ICD-10-CM

## 2021-05-06 MED ORDER — METOPROLOL SUCCINATE ER 25 MG PO TB24: 25.0000 mg | ORAL_TABLET | Freq: Every day | ORAL | 3 refills | Status: DC

## 2021-05-06 MED ORDER — LOSARTAN POTASSIUM 25 MG PO TABS: 25.0000 mg | ORAL_TABLET | Freq: Every day | ORAL | 3 refills | Status: DC

## 2021-05-06 NOTE — Progress Notes (Signed)
Cardiology Office Note:    Date:  05/06/2021   ID:  James Dudley, DOB 08-Aug-1970, MRN [id number]  PCP:  Practice, [facility]  Cardiologist:  Little Ishikawa, MD  Electrophysiologist:  None   Referring MD: Practice, Duanne Limerick*   No chief complaint on file.   History of Present Illness:    James Dudley is a 50 y.o. male with a hx of hyperlipidemia who presents for follow-up.  He was referred by Lonie Peak, PA for evaluation of shortness of breath and chest pain, initially seen on 03/28/2021.  He reports he has been having shortness of breath for the past year.  States that he is short of breath with minimal exertion.  Also has chest pain about once per month.  Describes as sharp pain on left side of chest.  Does not notice what brings it on.  Also gets diaphoretic during episodes of shortness of breath and feels lightheaded, but no syncope.  Denies any palpitations or lower extremity edema.  He had COVID-19 infection in March 2022 and lost 30 pounds.  Has smoked 2 packs/day for over 30 years, quit at age 79.  Family history includes father has had 5 PCIs, first in late 40s/early 3s.  Coronary CTA on 04/06/2021 showed mild nonobstructive CAD, calcium score 83 (83rd percentile).  Echo on 04/27/2021 showed EF 45 to 50%, grade 1 diastolic dysfunction, normal RV function, no significant valvular disease.  Since last clinic visit, he reports that he is doing well.  Denies any chest pain, dyspnea, lower extremity edema, or palpitations.  Does report that he has been having some lightheadedness with standing.    Past Medical History:  Diagnosis Date   Back injury    Collapsed lung    Hyperlipemia    Sciatic pain     Past Surgical History:  Procedure Laterality Date   BACK SURGERY     LUNG SURGERY     LUNG SURGERY     atalectasis    Current Medications: Current Meds  Medication Sig   AMITRIPTYLINE HCL PO Take 50-75 mg by mouth daily.   atorvastatin  (LIPITOR) 40 MG tablet Take 1 tablet (40 mg total) by mouth daily.   gabapentin (NEURONTIN) 600 MG tablet Take 600 mg by mouth daily.   losartan (COZAAR) 25 MG tablet Take 1 tablet (25 mg total) by mouth daily.   metoprolol succinate (TOPROL XL) 25 MG 24 hr tablet Take 1 tablet (25 mg total) by mouth daily.     Allergies:   Tramadol, Codeine, and Penicillins   Social History   Socioeconomic History   Marital status: Single    Spouse name: Not on file   Number of children: Not on file   Years of education: Not on file   Highest education level: Not on file  Occupational History   Not on file  Tobacco Use   Smoking status: Former    Packs/day: 1.00    Years: 25.00    Pack years: 25.00    Types: Cigarettes   Smokeless tobacco: Never  Substance and Sexual Activity   Alcohol use: No   Drug use: No   Sexual activity: Not on file  Other Topics Concern   Not on file  Social History Narrative   Not on file   Social Determinants of Health   Financial Resource Strain: Not on file  Food Insecurity: Not on file  Transportation Needs: Not on file  Physical Activity: Not on file  Stress: Not on file  Social Connections: Not on file     Family History: The patient's family history includes Diabetes in his mother; Hypertension in an other family member.  ROS:   Please see the history of present illness.     All other systems reviewed and are negative.  EKGs/Labs/Other Studies Reviewed:    The following studies were reviewed today:   EKG:   03/28/21: normal sinus rhythm, rate 83, no ST abnormalities  Recent Labs: 03/28/2021: BUN 10; Creatinine, Ser 0.91; Potassium 4.6; Sodium 142  Recent Lipid Panel    Component Value Date/Time   CHOL 235 (H) 02/11/2010 2147   TRIG 215 (H) 02/11/2010 2147   HDL 42 02/11/2010 2147   CHOLHDL 5.6 Ratio 02/11/2010 2147   VLDL 43 (H) 02/11/2010 2147   LDLCALC 150 (H) 02/11/2010 2147    Physical Exam:    VS:  BP 126/72    Pulse 78     Ht 5\' 10"  (1.778 m)    Wt 170 lb 9.6 oz (77.4 kg)    SpO2 95%    BMI 24.48 kg/m     Wt Readings from Last 3 Encounters:  05/06/21 170 lb 9.6 oz (77.4 kg)  03/28/21 170 lb (77.1 kg)  11/02/15 173 lb (78.5 kg)     GEN:  Well nourished, well developed in no acute distress HEENT: Normal NECK: No JVD; No carotid bruits LYMPHATICS: No lymphadenopathy CARDIAC: RRR, no murmurs, rubs, gallops RESPIRATORY:  Clear to auscultation without rales, wheezing or rhonchi  ABDOMEN: Soft, non-tender, non-distended MUSCULOSKELETAL:  No edema; No deformity  SKIN: Warm and dry NEUROLOGIC:  Alert and oriented x 3 PSYCHIATRIC:  Normal affect   ASSESSMENT:    1. Cardiomyopathy, unspecified type (HCC)   2. Chest pain of uncertain etiology   3. DOE (dyspnea on exertion)   4. Hyperlipidemia, unspecified hyperlipidemia type     PLAN:    Systolic dysfunction: EF 45 to 50% on echo 04/27/2021.  Nonobstructive CAD on coronary CTA 04/06/2021. -Start losartan 25 mg daily.  He reports some lightheadedness with standing but no drop in blood pressure with position change on orthostatics in clinic today.  We will check BMET in 1 week -If tolerating losartan, will add Toprol-XL 25 mg daily in 1 week -Cardiac MRI to work-up cause of cardiomyopathy  Chest pain/exertional dyspnea: Chest pain is atypical in description but does have significant CAD risk factors (tobacco use, family history, hyperlipidemia).  Coronary CTA on 04/06/2021 showed mild nonobstructive CAD, calcium score 83 (83rd percentile).  Echo on 04/27/2021 showed EF 45 to 50%, grade 1 diastolic dysfunction, normal RV function, no significant valvular disease.  Suspect systolic dysfunction contributing.  Hyperlipidemia: On simvastatin 20 mg daily.  LDL 127 on 01/19/2021.  Calcium score 83 (83rd percentile) on 04/06/2021.  Switched to atorvastatin 40 mg daily   RTC in 6 to 8 weeks   Medication Adjustments/Labs and Tests Ordered: Current medicines are  reviewed at length with the patient today.  Concerns regarding medicines are outlined above.  Orders Placed This Encounter  Procedures   MR CARDIAC MORPHOLOGY W WO CONTRAST   CBC   Comprehensive metabolic panel   TSH    Meds ordered this encounter  Medications   metoprolol succinate (TOPROL XL) 25 MG 24 hr tablet    Sig: Take 1 tablet (25 mg total) by mouth daily.    Dispense:  90 tablet    Refill:  3   losartan (COZAAR) 25 MG tablet  Sig: Take 1 tablet (25 mg total) by mouth daily.    Dispense:  90 tablet    Refill:  3     Patient Instructions  Medication Instructions:  PLEASE START: LOSARTAN 25mg  ONCE DAILY TODAY  IF TOLERATING OKAY- PLEASE START TOPROL XL 25mg  ONCE DAILY IN ONE WEEK_ - AROUND 12/30  *If you need a refill on your cardiac medications before your next appointment, please call your pharmacy*  Lab Work: CMET. CBC, AND TSH IN ONE WEEK- PLEASE RETURN FOR THIS. NO APPOINTMENT NEEDED. LAB HOURS ARE Monday-Friday FROM 8am-4pm If you have labs (blood work) drawn today and your tests are completely normal, you will receive your results only by: MyChart Message (if you have MyChart) OR A paper copy in the mail If you have any lab test that is abnormal or we need to change your treatment, we will call you to review the results.  Testing/Procedures:   You are scheduled for Cardiac MRI on ______________. Please arrive at the [facility] main entrance of [facility] at ________________ (30-45 minutes prior to test start time). ?  [facility] [address] [place], [address] [place] [phone]  Please take advantage of the free valet parking available at the MAIN entrance (A entrance). Proceed to the [facility] Radiology Department (First Floor). ? Magnetic resonance imaging (MRI) is a painless test that produces images of the inside of the body without using Xrays.  During an MRI, strong magnets and radio waves work together in a  [phone] to form detailed images.   MRI images may provide more details about a medical condition than X-rays, CT scans, and ultrasounds can provide.  You may be given earphones to listen for instructions.  You may eat a light breakfast and take medications as ordered with the exception of HCTZ (fluid pill, other). Please avoid stimulants for 12 hr prior to test. (Ie. Caffeine, nicotine, chocolate, or antihistamine medications)  If a contrast material will be used, an IV will be inserted into one of your veins. Contrast material will be injected into your IV. It will leave your body through your urine within a day. You may be told to drink plenty of fluids to help flush the contrast material out of your system.  You will be asked to remove all metal, including: Watch, jewelry, and other metal objects including hearing aids, hair pieces and dentures. Also wearable glucose monitoring systems (ie. Freestyle Libre and Omnipods) (Braces and fillings normally are not a problem.)   TEST WILL TAKE APPROXIMATELY 1 HOUR  PLEASE NOTIFY SCHEDULING AT LEAST 24 HOURS IN ADVANCE IF YOU ARE UNABLE TO KEEP YOUR APPOINTMENT. [phone]  Please call Data processing manager, cardiac imaging nurse navigator with any questions/concerns. [phone] RN Navigator Cardiac Imaging [place] [place] RN Navigator Cardiac Imaging [facility] [phone] Office    Follow-Up: At [facility], you and your health needs are our priority.  As part of our continuing mission to provide you with exceptional heart care, we have created designated Provider Care Teams.  These Care Teams include your primary Cardiologist (physician) and Advanced Practice Providers (APPs -  Physician Assistants and Nurse Practitioners) who all work together to provide you with the care you need, when you need it.  We recommend signing up for the patient portal called "MyChart".  Sign up information is provided on this  After Visit Summary.  MyChart is used to connect with patients for Virtual Visits (Telemedicine).  Patients are able to view lab/test results, encounter notes, upcoming appointments, etc.  Non-urgent messages can be sent to your provider as well.   To learn more about what you can do with MyChart, go to [email or web address].    Your next appointment:   6 week(s)  The format for your next appointment:   In Person  Provider:   Little Ishikawa, MD      Signed, Little Ishikawa, MD  05/06/2021 11:53 AM    [facility]

## 2021-05-06 NOTE — Patient Instructions (Signed)
Medication Instructions:  PLEASE START: LOSARTAN 25mg  ONCE DAILY TODAY  IF TOLERATING OKAY- PLEASE START TOPROL XL 25mg  ONCE DAILY IN ONE WEEK_ - AROUND 12/30  *If you need a refill on your cardiac medications before your next appointment, please call your pharmacy*  Lab Work: CMET. CBC, AND TSH IN ONE WEEK- PLEASE RETURN FOR THIS. NO APPOINTMENT NEEDED. LAB HOURS ARE Monday-Friday FROM 8am-4pm If you have labs (blood work) drawn today and your tests are completely normal, you will receive your results only by: MyChart Message (if you have MyChart) OR A paper copy in the mail If you have any lab test that is abnormal or we need to change your treatment, we will call you to review the results.  Testing/Procedures:   You are scheduled for Cardiac MRI on ______________. Please arrive at the [facility] main entrance of [facility] at ________________ (30-45 minutes prior to test start time). ?  [facility] [address] [place], [address] [place] [phone]  Please take advantage of the free valet parking available at the MAIN entrance (A entrance). Proceed to the [facility] Radiology Department (First Floor). ? Magnetic resonance imaging (MRI) is a painless test that produces images of the inside of the body without using Xrays.  During an MRI, strong magnets and radio waves work together in a [phone] to form detailed images.   MRI images may provide more details about a medical condition than X-rays, CT scans, and ultrasounds can provide.  You may be given earphones to listen for instructions.  You may eat a light breakfast and take medications as ordered with the exception of HCTZ (fluid pill, other). Please avoid stimulants for 12 hr prior to test. (Ie. Caffeine, nicotine, chocolate, or antihistamine medications)  If a contrast material will be used, an IV will be inserted into one of your veins. Contrast material will be injected into your IV. It  will leave your body through your urine within a day. You may be told to drink plenty of fluids to help flush the contrast material out of your system.  You will be asked to remove all metal, including: Watch, jewelry, and other metal objects including hearing aids, hair pieces and dentures. Also wearable glucose monitoring systems (ie. Freestyle Libre and Omnipods) (Braces and fillings normally are not a problem.)   TEST WILL TAKE APPROXIMATELY 1 HOUR  PLEASE NOTIFY SCHEDULING AT LEAST 24 HOURS IN ADVANCE IF YOU ARE UNABLE TO KEEP YOUR APPOINTMENT. [phone]  Please call Data processing manager, cardiac imaging nurse navigator with any questions/concerns. [phone] RN Navigator Cardiac Imaging [place] RN Navigator Cardiac Imaging [facility] [phone] Office    Follow-Up: At [facility], you and your health needs are our priority.  As part of our continuing mission to provide you with exceptional heart care, we have created designated Provider Care Teams.  These Care Teams include your primary Cardiologist (physician) and Advanced Practice Providers (APPs -  Physician Assistants and Nurse Practitioners) who all work together to provide you with the care you need, when you need it.  We recommend signing up for the patient portal called "MyChart".  Sign up information is provided on this After Visit Summary.  MyChart is used to connect with patients for Virtual Visits (Telemedicine).  Patients are able to view lab/test results, encounter notes, upcoming appointments, etc.  Non-urgent messages can be sent to your provider as well.   To learn more about what you  can do with MyChart, go to [email or web address].    Your next appointment:   6 week(s)  The format for your next appointment:   In Person  Provider:   Little Ishikawa, MD

## 2021-05-13 LAB — COMPREHENSIVE METABOLIC PANEL
ALT: 11 IU/L (ref 0–44)
AST: 17 IU/L (ref 0–40)
Albumin/Globulin Ratio: 2.4 — ABNORMAL HIGH (ref 1.2–2.2)
Albumin: 4.7 g/dL (ref 3.8–4.9)
Alkaline Phosphatase: 99 IU/L (ref 44–121)
BUN/Creatinine Ratio: 7 — ABNORMAL LOW (ref 9–20)
BUN: 6 mg/dL (ref 6–24)
Bilirubin Total: 0.5 mg/dL (ref 0.0–1.2)
CO2: 29 mmol/L (ref 20–29)
Calcium: 10.4 mg/dL — ABNORMAL HIGH (ref 8.7–10.2)
Chloride: 101 mmol/L (ref 96–106)
Creatinine, Ser: 0.91 mg/dL (ref 0.76–1.27)
Globulin, Total: 2 g/dL (ref 1.5–4.5)
Glucose: 85 mg/dL (ref 70–99)
Potassium: 3.9 mmol/L (ref 3.5–5.2)
Sodium: 143 mmol/L (ref 134–144)
Total Protein: 6.7 g/dL (ref 6.0–8.5)
eGFR: 101 mL/min/{1.73_m2} (ref >59)

## 2021-05-13 LAB — TSH: TSH: 0.561 u[IU]/mL (ref 0.450–4.500)

## 2021-05-13 LAB — CBC
Hematocrit: 41.5 % (ref 37.5–51.0)
Hemoglobin: 13.7 g/dL (ref 13.0–17.7)
MCH: 28.5 pg (ref 26.6–33.0)
MCHC: 33 g/dL (ref 31.5–35.7)
MCV: 87 fL (ref 79–97)
Platelets: 307 10*3/uL (ref 150–450)
RBC: 4.8 x10E6/uL (ref 4.14–5.80)
RDW: 12.4 % (ref 11.6–15.4)
WBC: 6.8 10*3/uL (ref 3.4–10.8)

## 2021-06-07 ENCOUNTER — Telehealth (HOSPITAL_COMMUNITY): Payer: Self-pay | Admitting: *Deleted

## 2021-06-07 NOTE — Telephone Encounter (Signed)
Reaching out to James Dudley to offer assistance regarding upcoming cardiac imaging study; pt verbalizes understanding of appt date/time, parking situation and where to check in, pre-test NPO status and verified current allergies; name and call back number provided for further questions should they arise  James Clement RN Navigator Cardiac Imaging [facility] [phone] office [phone] cell  James Dudley reports metal in lower back and a screw in his mouth but has had MRIs in the past with that hardware. He denies claustrophobia.

## 2021-06-08 ENCOUNTER — Emergency Department (HOSPITAL_COMMUNITY)
Admission: EM | Admit: 2021-06-08 | Discharge: 2021-06-08 | Disposition: A | Discharge status: Home / Self Care | Payer: Medicaid Other | Attending: Emergency Medicine | Admitting: Emergency Medicine

## 2021-06-08 ENCOUNTER — Ambulatory Visit (HOSPITAL_COMMUNITY)
Admission: RE | Admit: 2021-06-08 | Discharge: 2021-06-08 | Disposition: A | Discharge status: Home / Self Care | Payer: Medicaid Other | Source: Ambulatory Visit | Attending: Cardiology | Admitting: Cardiology

## 2021-06-08 ENCOUNTER — Emergency Department (HOSPITAL_COMMUNITY): Payer: Medicaid Other

## 2021-06-08 ENCOUNTER — Other Ambulatory Visit: Payer: Self-pay

## 2021-06-08 DIAGNOSIS — R079 Chest pain, unspecified: Secondary | ICD-10-CM | POA: Insufficient documentation

## 2021-06-08 DIAGNOSIS — R202 Paresthesia of skin: Secondary | ICD-10-CM | POA: Insufficient documentation

## 2021-06-08 DIAGNOSIS — Z5321 Procedure and treatment not carried out due to patient leaving prior to being seen by health care provider: Secondary | ICD-10-CM | POA: Diagnosis not present

## 2021-06-08 DIAGNOSIS — I429 Cardiomyopathy, unspecified: Secondary | ICD-10-CM | POA: Insufficient documentation

## 2021-06-08 LAB — CBC
HCT: 46 % (ref 39.0–52.0)
Hemoglobin: 14.9 g/dL (ref 13.0–17.0)
MCH: 29.2 pg (ref 26.0–34.0)
MCHC: 32.4 g/dL (ref 30.0–36.0)
MCV: 90.2 fL (ref 80.0–100.0)
Platelets: 311 10*3/uL (ref 150–400)
RBC: 5.1 MIL/uL (ref 4.22–5.81)
RDW: 12.6 % (ref 11.5–15.5)
WBC: 9 10*3/uL (ref 4.0–10.5)
nRBC: 0 % (ref 0.0–0.2)

## 2021-06-08 LAB — BASIC METABOLIC PANEL
Anion gap: 9 (ref 5–15)
BUN: 9 mg/dL (ref 6–20)
CO2: 27 mmol/L (ref 22–32)
Calcium: 9.2 mg/dL (ref 8.9–10.3)
Chloride: 103 mmol/L (ref 98–111)
Creatinine, Ser: 1.12 mg/dL (ref 0.61–1.24)
GFR, Estimated: >60 mL/min (ref >60)
Glucose, Bld: 91 mg/dL (ref 70–99)
Potassium: 3 mmol/L — ABNORMAL LOW (ref 3.5–5.1)
Sodium: 139 mmol/L (ref 135–145)

## 2021-06-08 LAB — TROPONIN I (HIGH SENSITIVITY): Troponin I (High Sensitivity): 2 ng/L (ref <18)

## 2021-06-08 MED ORDER — GADOBUTROL 1 MMOL/ML IV SOLN
10.0000 mL | Freq: Once | INTRAVENOUS | Status: AC | PRN
  Administered 2021-06-08: 11:00:00 10 mL via INTRAVENOUS

## 2021-06-08 NOTE — ED Triage Notes (Signed)
Pt here from getting cardiac MRI and had sudden onset of numbness around his mouth that radiated down his body w/ chest pressure. Pt denies all symptoms now

## 2021-06-08 NOTE — ED Notes (Signed)
Called patient several times for vital re-check patient didn't answer   

## 2021-06-08 NOTE — ED Notes (Signed)
Patient decided to leave couldn't wait no longer

## 2021-06-08 NOTE — Significant Event (Addendum)
Rapid Response Event Note   Reason for Call :  Chest pressure and upper extremity numbness  Pt developed sudden onset chest pressure "feels like someone is sitting on my chest" and extremity numbness towards the end of his MRI. Per MRI staff, scan was completed.   Initial Focused Assessment:  Pt lying on MRI table, AO. Lung sounds are clear. Chest pressure and extremity numbness resolved. Peripheral pulses 1-2+. No adventitious heart sounds. Skin is warm, dry, pale. Cool upper extremities.   Pt with 22 gauge PIV to right forearm.   Interventions:  Pt checked-in to ED for further evaluation. Family member present.   Event Summary:  Call Time: 1048 Arrival Time: 1050 End Time: 1115  Jennye Moccasin, RN

## 2021-06-08 NOTE — ED Provider Triage Note (Signed)
Emergency Medicine Provider Triage Evaluation Note  James Dudley , a 51 y.o. male  was evaluated in triage.  Pt complains of chest pain and numbness.  Patient was getting a cardiac MRI this morning when he said that he started to feel numb around his mouth and then all the way down his body.  Then he began feeling like something was sitting on his chest.  He has no symptoms at this time.  Review of Systems  Positive: Shortness of breath and numbness Negative: Weakness, headache, changes in vision  Physical Exam  BP (!) 125/95 (BP Location: Left Arm)    Pulse 100    Temp 98.6 F (37 C) (Oral)    Resp 18    SpO2 99%  Gen:   Awake, no distress   Resp:  Normal effort  MSK:   Moves extremities without difficulty  Other:  No diarphoresis  Medical Decision Making  Medically screening exam initiated at 11:24 AM.  Appropriate orders placed.  FIDEL CAGGIANO was informed that the remainder of the evaluation will be completed by another provider, this initial triage assessment does not replace that evaluation, and the importance of remaining in the ED until their evaluation is complete.  Suspect hyperventilation, panic in the setting of MRI, work-up initiated   Arthor Captain, PA-C 06/08/21 1126

## 2021-06-08 NOTE — ED Notes (Signed)
Called patient for vital recheck patient didn't answe

## 2021-06-19 NOTE — Progress Notes (Signed)
Cardiology Office Note:    Date:  06/21/2021   ID:  James Dudley, DOB Mar 28, 1971, MRN [id number]  PCP:  Practice, [facility]  Cardiologist:  Little Ishikawa, MD  Electrophysiologist:  None   Referring MD: Practice, Duanne Limerick*   Chief Complaint  Patient presents with   Follow-up    6 weeks.   Congestive Heart Failure    History of Present Illness:    James Dudley is a 51 y.o. male with a hx of hyperlipidemia who presents for follow-up.  He was referred by Lonie Peak, PA for evaluation of shortness of breath and chest pain, initially seen on 03/28/2021.  He reports he has been having shortness of breath for the past year.  States that he is short of breath with minimal exertion.  Also has chest pain about once per month.  Describes as sharp pain on left side of chest.  Does not notice what brings it on.  Also gets diaphoretic during episodes of shortness of breath and feels lightheaded, but no syncope.  Denies any palpitations or lower extremity edema.  He had COVID-19 infection in March 2022 and lost 30 pounds.  Has smoked 2 packs/day for over 30 years, quit at age 44.  Family history includes father has had 5 PCIs, first in late 40s/early 34s.  Coronary CTA on 04/06/2021 showed mild nonobstructive CAD, calcium score 83 (83rd percentile).  Echo on 04/27/2021 showed EF 45 to 50%, grade 1 diastolic dysfunction, normal RV function, no significant valvular disease.  Cardiac MRI on 06/08/2021 showed LVEF 54%, RVEF 52%, no LGE.  Since last clinic visit, he reports that he has been doing okay.  Denies any chest pain, dyspnea, lower extremity edema, or palpitations.  Does report he had a syncopal episode last week.  Did not seek any medical care after this.  Reports he was walking down the hall and suddenly passed out.  No prodromal symptoms.  He had not changed positions recently, had been on his feet for several minutes before he passed out.  Reports his friend told him  he was out for about a minute.  He does report some lightheadedness with standing on occasion.  He went to the ED after his cardiac MRI on 1/25, felt chest pain and numbness.  Troponin was negative, labs unremarkable except for low potassium (3.0).  Chest x-ray unremarkable.  He was feeling better and left the ED before he was seen.   Past Medical History:  Diagnosis Date   Back injury    Collapsed lung    Hyperlipemia    Sciatic pain     Past Surgical History:  Procedure Laterality Date   BACK SURGERY     LUNG SURGERY     LUNG SURGERY     atalectasis    Current Medications: Current Meds  Medication Sig   AMITRIPTYLINE HCL PO Take 50-75 mg by mouth daily.   atorvastatin (LIPITOR) 40 MG tablet Take 1 tablet (40 mg total) by mouth daily.   gabapentin (NEURONTIN) 600 MG tablet Take 600 mg by mouth daily.   losartan (COZAAR) 25 MG tablet Take 1 tablet (25 mg total) by mouth daily.   metoprolol succinate (TOPROL XL) 25 MG 24 hr tablet Take 1 tablet (25 mg total) by mouth daily.     Allergies:   Tramadol, Codeine, and Penicillins   Social History   Socioeconomic History   Marital status: Single    Spouse name: Not on file  Number of children: Not on file   Years of education: Not on file   Highest education level: Not on file  Occupational History   Not on file  Tobacco Use   Smoking status: Former    Packs/day: 1.00    Years: 25.00    Pack years: 25.00    Types: Cigarettes   Smokeless tobacco: Never  Substance and Sexual Activity   Alcohol use: No   Drug use: No   Sexual activity: Not on file  Other Topics Concern   Not on file  Social History Narrative   Not on file   Social Determinants of Health   Financial Resource Strain: Not on file  Food Insecurity: Not on file  Transportation Needs: Not on file  Physical Activity: Not on file  Stress: Not on file  Social Connections: Not on file     Family History: The patient's family history includes Diabetes  in his mother; Hypertension in an other family member.  ROS:   Please see the history of present illness.     All other systems reviewed and are negative.  EKGs/Labs/Other Studies Reviewed:    The following studies were reviewed today:   EKG:   03/28/21: normal sinus rhythm, rate 83, no ST abnormalities  Recent Labs: 05/13/2021: ALT 11; TSH 0.561 06/08/2021: BUN 9; Creatinine, Ser 1.12; Hemoglobin 14.9; Platelets 311; Potassium 3.0; Sodium 139  Recent Lipid Panel    Component Value Date/Time   CHOL 235 (H) 02/11/2010 2147   TRIG 215 (H) 02/11/2010 2147   HDL 42 02/11/2010 2147   CHOLHDL 5.6 Ratio 02/11/2010 2147   VLDL 43 (H) 02/11/2010 2147   LDLCALC 150 (H) 02/11/2010 2147    Physical Exam:    VS:  BP 124/86 (BP Location: Left Arm, Patient Position: Sitting, Cuff Size: Normal)    Pulse 95    Ht 5\' 10"  (1.778 m)    Wt 174 lb (78.9 kg)    BMI 24.97 kg/m     Wt Readings from Last 3 Encounters:  06/21/21 174 lb (78.9 kg)  05/06/21 170 lb 9.6 oz (77.4 kg)  03/28/21 170 lb (77.1 kg)     GEN:  Well nourished, well developed in no acute distress HEENT: Normal NECK: No JVD; No carotid bruits CARDIAC: RRR, no murmurs, rubs, gallops RESPIRATORY:  Clear to auscultation without rales, wheezing or rhonchi  ABDOMEN: Soft, non-tender, non-distended MUSCULOSKELETAL:  No edema; No deformity  SKIN: Warm and dry NEUROLOGIC:  Alert and oriented x 3 PSYCHIATRIC:  Normal affect   ASSESSMENT:    1. Syncope and collapse   2. Hyperlipidemia, unspecified hyperlipidemia type   3. Systolic dysfunction   4. Coronary artery disease involving native coronary artery of native heart without angina pectoris   5. Hypokalemia      PLAN:    Systolic dysfunction: EF 45 to 50% on echo 04/27/2021.  Nonobstructive CAD on coronary CTA 04/06/2021.  Cardiac MRI on 06/08/2021 showed LVEF 54%, RVEF 52%, no LGE. -Continue losartan 25 mg daily -Continue Toprol-XL 25 mg daily  Syncope: Description  concerning for arrhythmia given lack of prodromal symptoms.  Recommend Zio patch x2 weeks for further evaluation  CAD: Reported atypical chest pain.  Coronary CTA on 04/06/2021 showed mild nonobstructive CAD, calcium score 83 (83rd percentile).  Echo on 04/27/2021 showed EF 45 to 50%, grade 1 diastolic dysfunction, normal RV function, no significant valvular disease.   -Started atorvastatin 40 mg daily, will recheck lipid panel  Hyperlipidemia: On simvastatin  20 mg daily.  LDL 127 on 01/19/2021.  Calcium score 83 (83rd percentile) on 04/06/2021.  Switched to atorvastatin 40 mg daily.  We will recheck lipid panel  Hypokalemia: Potassium 3.0 on 06/08/2021.  Unclear cause, will recheck BMET, magnesium   RTC in 3 months   Medication Adjustments/Labs and Tests Ordered: Current medicines are reviewed at length with the patient today.  Concerns regarding medicines are outlined above.  Orders Placed This Encounter  Procedures   Basic Metabolic Panel (BMET)   Magnesium   Lipid panel   LONG TERM MONITOR (3-14 DAYS)    No orders of the defined types were placed in this encounter.    Patient Instructions   Testing/Procedures:  Christena DeemZIO XT- Long Term Monitor Instructions  Your physician has requested you wear a ZIO patch monitor for 14 days.  This is a single patch monitor. [employer] supplies one patch monitor per enrollment. Additional stickers are not available. Please do not apply patch if you will be having a Nuclear Stress Test,  Echocardiogram, Cardiac CT, MRI, or Chest Xray during the period you would be wearing the  monitor. The patch cannot be worn during these tests. You cannot remove and re-apply the  ZIO XT patch monitor.  Your ZIO patch monitor will be mailed 3 day USPS to your address on file. It may take 3-5 days  to receive your monitor after you have been enrolled.  Once you have received your monitor, please review the enclosed instructions. Your monitor  has already been  registered assigning a specific monitor serial # to you.  Billing and Patient Assistance Program Information  We have supplied [employer] with any of your insurance information on file for billing purposes. [employer] offers a sliding scale Patient Assistance Program for patients that do not have  insurance, or whose insurance does not completely cover the cost of the ZIO monitor.  You must apply for the Patient Assistance Program to qualify for this discounted rate.  To apply, please call [employer] at [phone], select option 4, select option 2, ask to apply for  Patient Assistance Program. Meredeth [employer] will ask your household income, and how many people  are in your household. They will quote your out-of-pocket cost based on that information.  [employer] will also be able to set up a 59105-month, interest-free payment plan if needed.  Applying the monitor   Shave hair from upper left chest.  Hold abrader disc by orange tab. Rub abrader in 40 strokes over the upper left chest as  indicated in your monitor instructions.  Clean area with 4 enclosed alcohol pads. Let dry.  Apply patch as indicated in monitor instructions. Patch will be placed under collarbone on left  side of chest with arrow pointing upward.  Rub patch adhesive wings for 2 minutes. Remove white label marked "1". Remove the white  label marked "2". Rub patch adhesive wings for 2 additional minutes.  While looking in a mirror, press and release button in center of patch. A small green light will  flash 3-4 times. This will be your only indicator that the monitor has been turned on.  Do not shower for the first 24 hours. You may shower after the first 24 hours.  Press the button if you feel a symptom. You will hear a small click. Record Date, Time and  Symptom in the Patient Logbook.  When you are ready to remove the patch, follow instructions on the last 2 pages of Patient  Logbook. Stick patch monitor  onto the last page of Patient  Logbook.  Place Patient Logbook in the blue and white box. Use locking tab on box and tape box closed  securely. The blue and white box has prepaid postage on it. Please place it in the mailbox as  soon as possible. Your physician should have your test results approximately 7 days after the  monitor has been mailed back to [facility].  Call [facility] Customer Care at [phone] if you have questions regarding  your ZIO XT patch monitor. Call them immediately if you see an orange light blinking on your  monitor.  If your monitor falls off in less than 4 days, contact our Monitor department at [phone].  If your monitor becomes loose or falls off after 4 days call [employer] at [phone] for  suggestions on securing your monitor    Follow-Up: At [facility], you and your health needs are our priority.  As part of our continuing mission to provide you with exceptional heart care, we have created designated Provider Care Teams.  These Care Teams include your primary Cardiologist (physician) and Advanced Practice Providers (APPs -  Physician Assistants and Nurse Practitioners) who all work together to provide you with the care you need, when you need it.  We recommend signing up for the patient portal called "MyChart".  Sign up information is provided on this After Visit Summary.  MyChart is used to connect with patients for Virtual Visits (Telemedicine).  Patients are able to view lab/test results, encounter notes, upcoming appointments, etc.  Non-urgent messages can be sent to your provider as well.   To learn more about what you can do with MyChart, go to [email or web address].    Your next appointment:   3 month(s)  The format for your next appointment:   In Person  Provider:   Little Ishikawa, MD       Signed, Little Ishikawa, MD  06/21/2021 2:03 PM    [facility]

## 2021-06-21 ENCOUNTER — Ambulatory Visit (INDEPENDENT_AMBULATORY_CARE_PROVIDER_SITE_OTHER): Payer: Medicaid Other

## 2021-06-21 ENCOUNTER — Encounter: Payer: Self-pay | Admitting: Cardiology

## 2021-06-21 ENCOUNTER — Ambulatory Visit: Payer: Medicaid Other | Admitting: Cardiology

## 2021-06-21 ENCOUNTER — Other Ambulatory Visit: Payer: Self-pay

## 2021-06-21 VITALS — BP 124/86 | HR 95 | Ht 70.0 in | Wt 174.0 lb

## 2021-06-21 DIAGNOSIS — I519 Heart disease, unspecified: Secondary | ICD-10-CM | POA: Diagnosis not present

## 2021-06-21 DIAGNOSIS — R55 Syncope and collapse: Secondary | ICD-10-CM

## 2021-06-21 DIAGNOSIS — E876 Hypokalemia: Secondary | ICD-10-CM

## 2021-06-21 DIAGNOSIS — E785 Hyperlipidemia, unspecified: Secondary | ICD-10-CM

## 2021-06-21 DIAGNOSIS — I2510 Atherosclerotic heart disease of native coronary artery without angina pectoris: Secondary | ICD-10-CM

## 2021-06-21 NOTE — Progress Notes (Unsigned)
Enrolled for [employer] to mail a ZIO XT long term holter monitor to the patients address on file.  

## 2021-06-21 NOTE — Patient Instructions (Signed)
Testing/Procedures:  James Dudley- Long Term Monitor Instructions  Your physician has requested you wear a ZIO patch monitor for 14 days.  This is a single patch monitor. [employer] supplies one patch monitor per enrollment. Additional stickers are not available. Please do not apply patch if you will be having a Nuclear Stress Test,  Echocardiogram, Cardiac CT, MRI, or Chest Xray during the period you would be wearing the  monitor. The patch cannot be worn during these tests. You cannot remove and re-apply the  ZIO XT patch monitor.  Your ZIO patch monitor will be mailed 3 day USPS to your address on file. It may take 3-5 days  to receive your monitor after you have been enrolled.  Once you have received your monitor, please review the enclosed instructions. Your monitor  has already been registered assigning a specific monitor serial # to you.  Billing and Patient Assistance Program Information  We have supplied [employer] with any of your insurance information on file for billing purposes. [employer] offers a sliding scale Patient Assistance Program for patients that do not have  insurance, or whose insurance does not completely cover the cost of the ZIO monitor.  You must apply for the Patient Assistance Program to qualify for this discounted rate.  To apply, please call [employer] at [phone], select option 4, select option 2, ask to apply for  Patient Assistance Program. Theodore Demark will ask your household income, and how many people  are in your household. They will quote your out-of-pocket cost based on that information.  [employer] will also be able to set up a 80-month, interest-free payment plan if needed.  Applying the monitor   Shave hair from upper left chest.  Hold abrader disc by orange tab. Rub abrader in 40 strokes over the upper left chest as  indicated in your monitor instructions.  Clean area with 4 enclosed alcohol pads. Let dry.  Apply patch as indicated in monitor instructions.  Patch will be placed under collarbone on left  side of chest with arrow pointing upward.  Rub patch adhesive wings for 2 minutes. Remove white label marked "1". Remove the white  label marked "2". Rub patch adhesive wings for 2 additional minutes.  While looking in a mirror, press and release button in center of patch. A small green light will  flash 3-4 times. This will be your only indicator that the monitor has been turned on.  Do not shower for the first 24 hours. You may shower after the first 24 hours.  Press the button if you feel a symptom. You will hear a small click. Record Date, Time and  Symptom in the Patient Logbook.  When you are ready to remove the patch, follow instructions on the last 2 pages of Patient  Logbook. Stick patch monitor onto the last page of Patient Logbook.  Place Patient Logbook in the blue and white box. Use locking tab on box and tape box closed  securely. The blue and white box has prepaid postage on it. Please place it in the mailbox as  soon as possible. Your physician should have your test results approximately 7 days after the  monitor has been mailed back to [facility].  Call [facility] at [phone] if you have questions regarding  your ZIO XT patch monitor. Call them immediately if you see an orange light blinking on your  monitor.  If your monitor falls off in less than 4 days, contact our Monitor department at [phone].  If your monitor becomes loose or falls off after 4 days call [employer] at [phone] for  suggestions on securing your monitor    Follow-Up: At [facility], you and your health needs are our priority.  As part of our continuing mission to provide you with exceptional heart care, we have created designated Provider Care Teams.  These Care Teams include your primary Cardiologist (physician) and Advanced Practice Providers (APPs -  Physician Assistants and Nurse Practitioners) who all work together to  provide you with the care you need, when you need it.  We recommend signing up for the patient portal called "MyChart".  Sign up information is provided on this After Visit Summary.  MyChart is used to connect with patients for Virtual Visits (Telemedicine).  Patients are able to view lab/test results, encounter notes, upcoming appointments, etc.  Non-urgent messages can be sent to your provider as well.   To learn more about what you can do with MyChart, go to [email or web address].    Your next appointment:   3 month(s)  The format for your next appointment:   In Person  Provider:   Donato Heinz, MD

## 2021-06-22 ENCOUNTER — Other Ambulatory Visit: Payer: Self-pay

## 2021-06-22 LAB — BASIC METABOLIC PANEL
BUN/Creatinine Ratio: 11 (ref 9–20)
BUN: 10 mg/dL (ref 6–24)
CO2: 28 mmol/L (ref 20–29)
Calcium: 10.1 mg/dL (ref 8.7–10.2)
Chloride: 99 mmol/L (ref 96–106)
Creatinine, Ser: 0.92 mg/dL (ref 0.76–1.27)
Glucose: 70 mg/dL (ref 70–99)
Potassium: 4.6 mmol/L (ref 3.5–5.2)
Sodium: 145 mmol/L — ABNORMAL HIGH (ref 134–144)
eGFR: 99 mL/min/{1.73_m2} (ref >59)

## 2021-06-22 LAB — LIPID PANEL
Chol/HDL Ratio: 3.5 ratio (ref 0.0–5.0)
Cholesterol, Total: 181 mg/dL (ref 100–199)
HDL: 52 mg/dL (ref >39)
LDL Chol Calc (NIH): 97 mg/dL (ref 0–99)
Triglycerides: 184 mg/dL — ABNORMAL HIGH (ref 0–149)
VLDL Cholesterol Cal: 32 mg/dL (ref 5–40)

## 2021-06-22 LAB — MAGNESIUM: Magnesium: 1.6 mg/dL (ref 1.6–2.3)

## 2021-06-22 MED ORDER — ATORVASTATIN CALCIUM 80 MG PO TABS: 80.0000 mg | ORAL_TABLET | Freq: Every day | ORAL | 3 refills | Status: DC

## 2021-06-25 DIAGNOSIS — R55 Syncope and collapse: Secondary | ICD-10-CM

## 2021-09-16 NOTE — Progress Notes (Signed)
? ?Cardiology Clinic Note  ? ?Patient Name: James Dudley ?Date of Encounter: 09/19/2021 ? ?Primary Care Provider:  Practice, [facility] ?Primary Cardiologist:  Donato Heinz, MD ? ?Patient Profile  ?  ?James Dudley 51 year old male presents to the clinic today for follow-up evaluation of his syncope. ? ?Past Medical History  ?  ?Past Medical History:  ?Diagnosis Date  ? Back injury   ? Collapsed lung   ? Hyperlipemia   ? Sciatic pain   ? ?Past Surgical History:  ?Procedure Laterality Date  ? BACK SURGERY    ? LUNG SURGERY    ? LUNG SURGERY    ? atalectasis  ? ? ?Allergies ? ?Allergies  ?Allergen Reactions  ? Codeine Rash  ? Penicillins Rash  ? Tramadol   ? ? ?History of Present Illness  ?  ?James Dudley has a PMH of syncope, hyperlipidemia, systolic dysfunction, coronary artery disease, and hypokalemia.  Coronary CTA 04/06/2021 showed mild nonobstructive CAD, echocardiogram 04/27/2021 showed an EF of 45-50, G1 DD, normal RV function, no significant valvular abnormalities.  Cardiac MRI 06/08/2021 showed LVEF of 54%, RVEF 52%, and no LGE. ? ?He was seen by Dr. Gardiner Rhyme on 06/21/2021.  He reported having had COVID infection 3/22.  During that time he lost 30 pounds.  He had smoked 2 packs/day for over 30 years.  He quit at age 23.  His family history includes father with 5 PCI's and the first being in his late 57s early 55s. ? ?During that time he reported that he had been doing okay.  He denied chest pain dyspnea and lower extremity swelling.  He denied palpitations.  He did report an episode of syncope the previous week.  He did not seek medical attention after the event.  He had been walking down the [place] and suddenly passed out.  He had no prodromal symptoms.  He had not changed position and had been on his feet for several minutes before he passed out.  A friend informed him that he had passed out for about a minute.  He did report some occasional lightheadedness with standing.  He had  presented to the emergency department after his cardiac MRI.  He had symptoms of chest pain and numbness.  His troponins were negative.  Labs were unremarkable except for a low potassium of 3.0.  Chest x-ray was unremarkable.  He began feeling better and left the ED before being seen. ? ?He presents to the clinic today for follow-up evaluation and states he feels well.  He continues to report occasional brief episodes of lightheadedness.  This occurs at random.  He reports the episodes dissipate as soon as he squats down or sits down.  His cardiac event monitor 07/18/2021 showed no significant arrhythmia and brief episodes of SVT.  Cardiac MRI.  He expressed understanding.  We reviewed vagal maneuvers and changing positions slowly.  He reported that he does not drink caffeine, chocolate, drink alcohol and tries to stay well-hydrated.  However will have him increase his physical activity as tolerated, continue current medication, and plan follow-up for 4 to 6 months. ? ? ?Today denies chest pain, shortness of breath, lower extremity edema, fatigue, palpitations, melena, hematuria, hemoptysis, diaphoresis, weakness, presyncope, syncope, orthopnea, and PND. ? ? ?Home Medications  ?  ?Prior to Admission medications   ?Medication Sig Start Date End Date Taking? Authorizing Provider  ?AMITRIPTYLINE HCL PO Take 50-75 mg by mouth daily.    [provider]  ?atorvastatin (  LIPITOR) 80 MG tablet Take 1 tablet (80 mg total) by mouth daily. 06/22/21   Donato Heinz, MD  ?gabapentin (NEURONTIN) 600 MG tablet Take 600 mg by mouth daily.    [provider]  ?losartan (COZAAR) 25 MG tablet Take 1 tablet (25 mg total) by mouth daily. 05/06/21   Donato Heinz, MD  ?metoprolol succinate (TOPROL XL) 25 MG 24 hr tablet Take 1 tablet (25 mg total) by mouth daily. 05/06/21   Donato Heinz, MD  ? ? ?Family History  ?  ?Family History  ?Problem Relation Age of Onset  ? Diabetes Mother   ?  Hypertension Other   ? ?He indicated that the status of his mother is unknown. He indicated that the status of his other is unknown. ? ?Social History  ?  ?Social History  ? ?Socioeconomic History  ? Marital status: Single  ?  Spouse name: Not on file  ? Number of children: Not on file  ? Years of education: Not on file  ? Highest education level: Not on file  ?Occupational History  ? Not on file  ?Tobacco Use  ? Smoking status: Former  ?  Packs/day: 1.00  ?  Years: 25.00  ?  Pack years: 25.00  ?  Types: Cigarettes  ? Smokeless tobacco: Never  ?Substance and Sexual Activity  ? Alcohol use: No  ? Drug use: No  ? Sexual activity: Not on file  ?Other Topics Concern  ? Not on file  ?Social History Narrative  ? Not on file  ? ?Social Determinants of Health  ? ?Financial Resource Strain: Not on file  ?Food Insecurity: Not on file  ?Transportation Needs: Not on file  ?Physical Activity: Not on file  ?Stress: Not on file  ?Social Connections: Not on file  ?Intimate Partner Violence: Not on file  ?  ? ?Review of Systems  ?  ?General:  No chills, fever, night sweats or weight changes.  ?Cardiovascular:  No chest pain, dyspnea on exertion, edema, orthopnea, palpitations, paroxysmal nocturnal dyspnea. ?Dermatological: No rash, lesions/masses ?Respiratory: No cough, dyspnea ?Urologic: No hematuria, dysuria ?Abdominal:   No nausea, vomiting, diarrhea, bright red blood per rectum, melena, or hematemesis ?Neurologic:  No visual changes, wkns, changes in mental status. ?All other systems reviewed and are otherwise negative except as noted above. ? ?Physical Exam  ?  ?VS:  BP 128/88   Pulse 84   Ht 5\' 10"  (1.778 m)   Wt 175 lb (79.4 kg)   SpO2 97%   BMI 25.11 kg/m?  , BMI Body mass index is 25.11 kg/m?. ?GEN: Well nourished, well developed, in no acute distress. ?HEENT: normal. ?Neck: Supple, no JVD, carotid bruits, or masses. ?Cardiac: RRR, no murmurs, rubs, or gallops. No clubbing, cyanosis, edema.  Radials/DP/PT 2+ and equal  bilaterally.  ?Respiratory:  Respirations regular and unlabored, clear to auscultation bilaterally. ?GI: Soft, nontender, nondistended, BS + x 4. ?MS: no deformity or atrophy. ?Skin: warm and dry, no rash. ?Neuro:  Strength and sensation are intact. ?Psych: Normal affect. ? ?Accessory Clinical Findings  ?  ?Recent Labs: ?05/13/2021: ALT 11; TSH 0.561 ?06/08/2021: Hemoglobin 14.9; Platelets 311 ?06/21/2021: BUN 10; Creatinine, Ser 0.92; Magnesium 1.6; Potassium 4.6; Sodium 145  ? ?Recent Lipid Panel ?   ?Component Value Date/Time  ? CHOL 181 06/21/2021 1413  ? TRIG 184 (H) 06/21/2021 1413  ? HDL 52 06/21/2021 1413  ? CHOLHDL 3.5 06/21/2021 1413  ? CHOLHDL 5.6 Ratio 02/11/2010 2147  ? VLDL  43 (H) 02/11/2010 2147  ? [place] 97 06/21/2021 1413  ? ? ?ECG personally reviewed by me today-none today. ? ?Echocardiogram 04/27/2021 ?IMPRESSIONS  ? ? ? 1. Left ventricular ejection fraction, by estimation, is 45 to 50%. The  ?left ventricle has mildly decreased function. The left ventricle  ?demonstrates global hypokinesis. Left ventricular diastolic parameters are  ?consistent with Grade I diastolic  ?dysfunction (impaired relaxation).  ? 2. Right ventricular systolic function is normal. The right ventricular  ?size is normal. Tricuspid regurgitation signal is inadequate for assessing  ?PA pressure.  ? 3. The mitral valve is normal in structure. No evidence of mitral valve  ?regurgitation. No evidence of mitral stenosis.  ? 4. The aortic valve is tricuspid. Aortic valve regurgitation is not  ?visualized. No aortic stenosis is present.  ? 5. The inferior vena cava is normal in size with greater than 50%  ?respiratory variability, suggesting right atrial pressure of 3 mmHg.  ? ?Coronary CTA 04/06/2021 ? ?FINDINGS: ?A 100 kV prospective scan was triggered in the descending thoracic ?aorta at 111 HU's. Axial non-contrast 3 mm slices were carried out ?through the heart. The data set was analyzed on a dedicated work ?station and scored  using the Sullivan. Gantry rotation speed ?was 250 msecs and collimation was .6 mm. No beta blockade and 0.8 mg ?of sl NTG was given. The 3D data set was reconstructed in 5% ?intervals of the 67-82 % of the R-R

## 2021-09-19 ENCOUNTER — Ambulatory Visit: Payer: Medicaid Other | Admitting: General Practice

## 2021-09-19 ENCOUNTER — Encounter: Payer: Self-pay | Admitting: General Practice

## 2021-09-19 VITALS — BP 128/88 | HR 84 | Ht 70.0 in | Wt 175.0 lb

## 2021-09-19 DIAGNOSIS — R55 Syncope and collapse: Secondary | ICD-10-CM | POA: Diagnosis not present

## 2021-09-19 DIAGNOSIS — I2510 Atherosclerotic heart disease of native coronary artery without angina pectoris: Secondary | ICD-10-CM

## 2021-09-19 DIAGNOSIS — E785 Hyperlipidemia, unspecified: Secondary | ICD-10-CM | POA: Diagnosis not present

## 2021-09-19 DIAGNOSIS — I519 Heart disease, unspecified: Secondary | ICD-10-CM

## 2021-09-19 NOTE — Patient Instructions (Signed)
Medication Instructions:  ?Your physician recommends that you continue on your current medications as directed. Please refer to the Current Medication list given to you today. ?*If you need a refill on your cardiac medications before your next appointment, please call your pharmacy* ? ? ?Lab Work: ?None Ordered ? ? ?Testing/Procedures: ?None Ordered ? ? ?Follow-Up: ?At [facility], you and your health needs are our priority.  As part of our continuing mission to provide you with exceptional heart care, we have created designated Provider Care Teams.  These Care Teams include your primary Cardiologist (physician) and Advanced Practice Providers (APPs -  Physician Assistants and Nurse Practitioners) who all work together to provide you with the care you need, when you need it. ? ?We recommend signing up for the patient portal called "MyChart".  Sign up information is provided on this After Visit Summary.  MyChart is used to connect with patients for Virtual Visits (Telemedicine).  Patients are able to view lab/test results, encounter notes, upcoming appointments, etc.  Non-urgent messages can be sent to your provider as well.   ?To learn more about what you can do with MyChart, go to [email or web address].   ? ?Your next appointment:   ?4-6 month(s) ? ?The format for your next appointment:   ?In Person ? ?Provider:   ?Little Ishikawa, MD or Edd Fabian, NP  ? ? ?Other Instructions ?DO NOT EAT TOO MUCH CHOCOLATE, DRINK TOO MUCH COFFEE OR ALCOHOL ?STAY HYDRATED ?WHEN YOU FEEL EPISODE KNEEL DOWN OR SIT DOWN  ? ?Important Information About Sugar ? ? ? ? ?  ?

## 2021-10-26 ENCOUNTER — Ambulatory Visit: Payer: Medicaid Other | Admitting: Cardiology

## 2022-03-06 ENCOUNTER — Encounter: Payer: Self-pay | Admitting: Cardiology

## 2022-03-06 ENCOUNTER — Ambulatory Visit: Payer: Medicaid Other | Attending: Cardiology | Admitting: Cardiology

## 2022-03-06 VITALS — BP 132/88 | HR 91 | Ht 70.0 in | Wt 167.0 lb

## 2022-03-06 DIAGNOSIS — R55 Syncope and collapse: Secondary | ICD-10-CM | POA: Diagnosis not present

## 2022-03-06 DIAGNOSIS — E785 Hyperlipidemia, unspecified: Secondary | ICD-10-CM | POA: Diagnosis not present

## 2022-03-06 DIAGNOSIS — I2510 Atherosclerotic heart disease of native coronary artery without angina pectoris: Secondary | ICD-10-CM

## 2022-03-06 DIAGNOSIS — E876 Hypokalemia: Secondary | ICD-10-CM

## 2022-03-06 DIAGNOSIS — I5022 Chronic systolic (congestive) heart failure: Secondary | ICD-10-CM

## 2022-03-06 LAB — LIPID PANEL
Chol/HDL Ratio: 2.1 ratio (ref 0.0–5.0)
Cholesterol, Total: 126 mg/dL (ref 100–199)
HDL: 61 mg/dL (ref >39)
LDL Chol Calc (NIH): 52 mg/dL (ref 0–99)
Triglycerides: 60 mg/dL (ref 0–149)
VLDL Cholesterol Cal: 13 mg/dL (ref 5–40)

## 2022-03-06 LAB — BASIC METABOLIC PANEL
BUN/Creatinine Ratio: 13 (ref 9–20)
BUN: 13 mg/dL (ref 6–24)
CO2: 28 mmol/L (ref 20–29)
Calcium: 10.1 mg/dL (ref 8.7–10.2)
Chloride: 103 mmol/L (ref 96–106)
Creatinine, Ser: 0.99 mg/dL (ref 0.76–1.27)
Glucose: 96 mg/dL (ref 70–99)
Potassium: 4.7 mmol/L (ref 3.5–5.2)
Sodium: 141 mmol/L (ref 134–144)
eGFR: 91 mL/min/{1.73_m2} (ref >59)

## 2022-03-06 NOTE — Progress Notes (Signed)
Cardiology Office Note:    Date:  03/06/2022   ID:  James Dudley, DOB [birth year], MRN [id number]  PCP:  Practice, [facility]  Cardiologist:  Little Ishikawa, MD  Electrophysiologist:  None   Referring MD: Practice, Duanne Limerick*   Chief Complaint  Patient presents with   Follow-up    History of Present Illness:    James Dudley is a 51 y.o. male with a hx of hyperlipidemia who presents for follow-up.  He was referred by Lonie Peak, PA for evaluation of shortness of breath and chest pain, initially seen on 03/28/2021.  He reports he has been having shortness of breath for the past year.  States that he is short of breath with minimal exertion.  Also has chest pain about once per month.  Describes as sharp pain on left side of chest.  Does not notice what brings it on.  Also gets diaphoretic during episodes of shortness of breath and feels lightheaded, but no syncope.  Denies any palpitations or lower extremity edema.  He had COVID-19 infection in March 2022 and lost 30 pounds.  Has smoked 2 packs/day for over 30 years, quit at age 23.  Family history includes father has had 5 PCIs, first in late 40s/early 26s.  Coronary CTA on 04/06/2021 showed mild nonobstructive CAD, calcium score 83 (83rd percentile).  Echo on 04/27/2021 showed EF 45 to 50%, grade 1 diastolic dysfunction, normal RV function, no significant valvular disease.  Cardiac MRI on 06/08/2021 showed LVEF 54%, RVEF 52%, no LGE.  Zio patch x14 days on 07/18/2021 showed no significant arrhythmias.  Since last clinic visit, he reports he has been doing okay.  Denies any chest pain, dyspnea, lower extremity edema, or palpitations.  Reports continues to have intermittent lightheadedness with standing.  No syncope.  Has rash on hands, has appointment today with PCP.  Past Medical History:  Diagnosis Date   Back injury    Collapsed lung    Hyperlipemia    Sciatic pain     Past Surgical History:  Procedure  Laterality Date   BACK SURGERY     LUNG SURGERY     LUNG SURGERY     atalectasis    Current Medications: Current Meds  Medication Sig   AMITRIPTYLINE HCL PO Take 100 mg by mouth daily.   atorvastatin (LIPITOR) 80 MG tablet Take 1 tablet (80 mg total) by mouth daily.   losartan (COZAAR) 25 MG tablet Take 1 tablet (25 mg total) by mouth daily.   metoprolol succinate (TOPROL XL) 25 MG 24 hr tablet Take 1 tablet (25 mg total) by mouth daily.     Allergies:   Other, Codeine, Penicillins, and Tramadol   Social History   Socioeconomic History   Marital status: Single    Spouse name: Not on file   Number of children: Not on file   Years of education: Not on file   Highest education level: Not on file  Occupational History   Not on file  Tobacco Use   Smoking status: Former    Packs/day: 1.00    Years: 25.00    Total pack years: 25.00    Types: Cigarettes   Smokeless tobacco: Never  Substance and Sexual Activity   Alcohol use: No   Drug use: No   Sexual activity: Not on file  Other Topics Concern   Not on file  Social History Narrative   Not on file   Social Determinants of Health  Financial Resource Strain: Not on file  Food Insecurity: Not on file  Transportation Needs: Not on file  Physical Activity: Not on file  Stress: Not on file  Social Connections: Not on file     Family History: The patient's family history includes Diabetes in his mother; Hypertension in an other family member.  ROS:   Please see the history of present illness.     All other systems reviewed and are negative.  EKGs/Labs/Other Studies Reviewed:    The following studies were reviewed today:   EKG:   03/28/21: normal sinus rhythm, rate 83, no ST abnormalities 03/06/22: Normal sinus rhythm, rate 91, PACs  Recent Labs: 05/13/2021: ALT 11; TSH 0.561 06/08/2021: Hemoglobin 14.9; Platelets 311 06/21/2021: BUN 10; Creatinine, Ser 0.92; Magnesium 1.6; Potassium 4.6; Sodium 145  Recent  Lipid Panel    Component Value Date/Time   CHOL 181 06/21/2021 1413   TRIG 184 (H) 06/21/2021 1413   HDL 52 06/21/2021 1413   CHOLHDL 3.5 06/21/2021 1413   CHOLHDL 5.6 Ratio 02/11/2010 2147   VLDL 43 (H) 02/11/2010 2147   LDLCALC 97 06/21/2021 1413    Physical Exam:    VS:  BP 132/88 (BP Location: Left Arm, Patient Position: Sitting, Cuff Size: Normal)   Pulse 91   Ht 5\' 10"  (1.778 m)   Wt 167 lb (75.8 kg)   SpO2 98%   BMI 23.96 kg/m     Wt Readings from Last 3 Encounters:  03/06/22 167 lb (75.8 kg)  09/19/21 175 lb (79.4 kg)  06/21/21 174 lb (78.9 kg)     GEN:  Well nourished, well developed in no acute distress HEENT: Normal NECK: No JVD; No carotid bruits CARDIAC: RRR, no murmurs, rubs, gallops RESPIRATORY:  Clear to auscultation without rales, wheezing or rhonchi  ABDOMEN: Soft, non-tender, non-distended MUSCULOSKELETAL:  No edema; No deformity  SKIN: Warm and dry NEUROLOGIC:  Alert and oriented x 3 PSYCHIATRIC:  Normal affect   ASSESSMENT:    1. Chronic systolic heart failure (HCC)   2. Syncope and collapse   3. Coronary artery disease involving native coronary artery of native heart without angina pectoris   4. Hyperlipidemia, unspecified hyperlipidemia type   5. Hypokalemia       PLAN:    Chronic systolic heart failure: EF 45 to 50% on echo 04/27/2021.  Nonobstructive CAD on coronary CTA 04/06/2021.  Cardiac MRI on 06/08/2021 showed LVEF 54%, RVEF 52%, no LGE.  Appears euvolemic. -Continue losartan 25 mg daily -Continue Toprol-XL 25 mg daily  Syncope: Description concerning for arrhythmia given lack of prodromal symptoms.  Zio patch x14 days on 07/18/2021 showed no significant arrhythmias. -Denies any recent syncope but is reporting lightheadedness with standing.  Orthostatics in clinic today showed mild drop in BP with standing but not meeting criteria for orthostatic hypotension (Lying 130/85 > 117/80 standing, improved to 123/95 at 3 minutes).   Encouraged to stay hydrated and trial compression stockings  CAD: Reported atypical chest pain.  Coronary CTA on 04/06/2021 showed mild nonobstructive CAD, calcium score 83 (83rd percentile).  Echo on 04/27/2021 showed EF 45 to 50%, grade 1 diastolic dysfunction, normal RV function, no significant valvular disease.   -Continue atorvastatin 80 mg daily  Hyperlipidemia: On simvastatin 20 mg daily.  LDL 127 on 01/19/2021.  Calcium score 83 (83rd percentile) on 04/06/2021.  Switched to atorvastatin 40 mg daily.  LDL 97 on 06/21/2021.  Atorvastatin increased to 80 mg daily. Check lipid panel.  Hypokalemia: Potassium 3.0 on 06/08/2021.  Unclear cause, potassium 4.6 on 06/21/2021.  Check BMET   RTC in 6 months   Medication Adjustments/Labs and Tests Ordered: Current medicines are reviewed at length with the patient today.  Concerns regarding medicines are outlined above.  Orders Placed This Encounter  Procedures   Basic metabolic panel   Lipid panel   EKG 12-Lead    No orders of the defined types were placed in this encounter.    Patient Instructions  Medication Instructions:  Your physician recommends that you continue on your current medications as directed. Please refer to the Current Medication list given to you today.  *If you need a refill on your cardiac medications before your next appointment, please call your pharmacy*  Lab Work: BMET, Lipid today  If you have labs (blood work) drawn today and your tests are completely normal, you will receive your results only by: [place] (if you have MyChart) OR A paper copy in the mail If you have any lab test that is abnormal or we need to change your treatment, we will call you to review the results.  Follow-Up: At [facility], you and your health needs are our priority.  As part of our continuing mission to provide you with exceptional heart care, we have created designated Provider Care Teams.  These Care Teams include  your primary Cardiologist (physician) and Advanced Practice Providers (APPs -  Physician Assistants and Nurse Practitioners) who all work together to provide you with the care you need, when you need it.  We recommend signing up for the patient portal called "MyChart".  Sign up information is provided on this After Visit Summary.  MyChart is used to connect with patients for Virtual Visits (Telemedicine).  Patients are able to view lab/test results, encounter notes, upcoming appointments, etc.  Non-urgent messages can be sent to your provider as well.   To learn more about what you can do with MyChart, go to [email or web address].    Your next appointment:   6 month(s)  The format for your next appointment:   In Person  Provider:   Donato Heinz, MD             Signed, Donato Heinz, MD  03/06/2022 9:07 AM    [place]

## 2022-03-06 NOTE — Patient Instructions (Signed)
Medication Instructions:  Your physician recommends that you continue on your current medications as directed. Please refer to the Current Medication list given to you today.  *If you need a refill on your cardiac medications before your next appointment, please call your pharmacy*  Lab Work: BMET, Lipid today  If you have labs (blood work) drawn today and your tests are completely normal, you will receive your results only by: [place] (if you have MyChart) OR A paper copy in the mail If you have any lab test that is abnormal or we need to change your treatment, we will call you to review the results.  Follow-Up: At [facility], you and your health needs are our priority.  As part of our continuing mission to provide you with exceptional heart care, we have created designated Provider Care Teams.  These Care Teams include your primary Cardiologist (physician) and Advanced Practice Providers (APPs -  Physician Assistants and Nurse Practitioners) who all work together to provide you with the care you need, when you need it.  We recommend signing up for the patient portal called "MyChart".  Sign up information is provided on this After Visit Summary.  MyChart is used to connect with patients for Virtual Visits (Telemedicine).  Patients are able to view lab/test results, encounter notes, upcoming appointments, etc.  Non-urgent messages can be sent to your provider as well.   To learn more about what you can do with MyChart, go to [email or web address].    Your next appointment:   6 month(s)  The format for your next appointment:   In Person  Provider:   Donato Heinz, MD

## 2022-03-08 ENCOUNTER — Encounter: Payer: Self-pay | Admitting: *Deleted

## 2022-06-20 ENCOUNTER — Other Ambulatory Visit: Payer: Self-pay | Admitting: Cardiology

## 2022-06-26 ENCOUNTER — Other Ambulatory Visit: Payer: Self-pay | Admitting: Cardiology

## 2022-07-20 NOTE — Progress Notes (Signed)
Cardiology Office Note:    Date:  07/21/2022   ID:  James Dudley, DOB February 09, 1971, MRN [id number]  PCP:  P[facility], [facility]  Cardiologist:  James Heinz, MD  Electrophysiologist:  None   Referring MD: Practice, [place]   Chief Complaint  Patient presents with   Follow-up    6 months.   Headache   Congestive Heart Failure    History of Present Illness:    James Dudley is a 52 y.o. male with a hx of hyperlipidemia who presents for follow-up.  He was referred by Cyndi Bender, PA for evaluation of shortness of breath and chest pain, initially seen on 03/28/2021.  He reports he has been having shortness of breath for the past year.  States that he is short of breath with minimal exertion.  Also has chest pain about once per month.  Describes as sharp pain on left side of chest.  Does not notice what brings it on.  Also gets diaphoretic during episodes of shortness of breath and feels lightheaded, but no syncope.  Denies any palpitations or lower extremity edema.  He had COVID-19 infection in March 2022 and lost 30 pounds.  Has smoked 2 packs/day for over 30 years, quit at age 70.  Family history includes father has had 5 PCIs, first in late 40s/early 19s.  Coronary CTA on 04/06/2021 showed mild nonobstructive CAD, calcium score 83 (83rd percentile).  Echo on 04/27/2021 showed EF 45 to 69%, grade 1 diastolic dysfunction, normal RV function, no significant valvular disease.  Cardiac MRI on 06/08/2021 showed LVEF 54%, RVEF 52%, no LGE.  Zio patch x14 days on 07/18/2021 showed no significant arrhythmias.  Since last clinic visit, he reports he has been doing okay.  Had some low blood pressures in February felt lightheaded but recently states BP has been normal.  Denies any chest pain, dyspnea, lightheadedness, syncope, lower extremity edema, or palpitations.   Past Medical History:  Diagnosis Date   Back injury    Collapsed lung    Hyperlipemia    Sciatic pain      Past Surgical History:  Procedure Laterality Date   BACK SURGERY     LUNG SURGERY     LUNG SURGERY     atalectasis    Current Medications: Current Meds  Medication Sig   AMITRIPTYLINE HCL PO Take 100 mg by mouth daily.   atorvastatin (LIPITOR) 80 MG tablet Take 1 tablet (80 mg total) by mouth daily.   gabapentin (NEURONTIN) 600 MG tablet Take 600 mg by mouth daily.   losartan (COZAAR) 25 MG tablet Take 1 tablet (25 mg total) by mouth daily.   metoprolol succinate (TOPROL-XL) 25 MG 24 hr tablet TAKE 1 TABLET BY MOUTH DAILY     Allergies:   Other, Codeine, Penicillins, and Tramadol   Social History   Socioeconomic History   Marital status: Single    Spouse name: Not on file   Number of children: Not on file   Years of education: Not on file   Highest education level: Not on file  Occupational History   Not on file  Tobacco Use   Smoking status: Former    Packs/day: 1.00    Years: 25.00    Total pack years: 25.00    Types: Cigarettes   Smokeless tobacco: Never  Substance and Sexual Activity   Alcohol use: No   Drug use: No   Sexual activity: Not on file  Other Topics Concern  Not on file  Social History Narrative   Not on file   Social Determinants of Health   Financial Resource Strain: Not on file  Food Insecurity: Not on file  Transportation Needs: Not on file  Physical Activity: Not on file  Stress: Not on file  Social Connections: Not on file     Family History: The patient's family history includes Diabetes in his mother; Hypertension in an other family member.  ROS:   Please see the history of present illness.     All other systems reviewed and are negative.  EKGs/Labs/Other Studies Reviewed:    The following studies were reviewed today:   EKG:   03/28/21: normal sinus rhythm, rate 83, no ST abnormalities 03/06/22: Normal sinus rhythm, rate 91, PACs  Recent Labs: 03/06/2022: BUN 13; Creatinine, Ser 0.99; Potassium 4.7; Sodium 141   Recent Lipid Panel    Component Value Date/Time   CHOL 126 03/06/2022 0921   TRIG 60 03/06/2022 0921   HDL 61 03/06/2022 0921   CHOLHDL 2.1 03/06/2022 0921   CHOLHDL 5.6 Ratio 02/11/2010 2147   VLDL 43 (H) 02/11/2010 2147   LDLCALC 52 03/06/2022 0921    Physical Exam:    VS:  BP 116/74 (BP Location: Left Arm, Patient Position: Sitting, Cuff Size: Normal)   Pulse 79   Ht 5\' 10"  (1.778 m)   Wt 164 lb (74.4 kg)   BMI 23.53 kg/m     Wt Readings from Last 3 Encounters:  07/21/22 164 lb (74.4 kg)  03/06/22 167 lb (75.8 kg)  09/19/21 175 lb (79.4 kg)     GEN:  Well nourished, well developed in no acute distress HEENT: Normal NECK: No JVD; No carotid bruits CARDIAC: RRR, no murmurs, rubs, gallops RESPIRATORY:  Clear to auscultation without rales, wheezing or rhonchi  ABDOMEN: Soft, non-tender, non-distended MUSCULOSKELETAL:  No edema; No deformity  SKIN: Warm and dry NEUROLOGIC:  Alert and oriented x 3 PSYCHIATRIC:  Normal affect   ASSESSMENT:    1. Chronic systolic heart failure ([place])   2. Syncope and collapse   3. Coronary artery disease involving native coronary artery of native heart without angina pectoris   4. Hyperlipidemia, unspecified hyperlipidemia type      PLAN:    Chronic systolic heart failure: EF 45 to 50% on echo 04/27/2021.  Nonobstructive CAD on coronary CTA 04/06/2021.  Cardiac MRI on 06/08/2021 showed LVEF 54%, RVEF 52%, no LGE.  Appears euvolemic. -Continue losartan 25 mg daily -Continue Toprol-XL 25 mg daily -Check echocardiogram -BP normal in clinic today, reports last months was having some low blood pressures and felt lightheaded.  Asked to check BP twice daily for next week and let us know results.    Syncope: Description concerning for arrhythmia given lack of prodromal symptoms.  Zio patch x14 days on 07/18/2021 showed no significant arrhythmias. -Denies any recent syncope but reported lightheadedness.  Orthostatics at prior clinic visit  showed mild drop in BP with standing but not meeting criteria for orthostatic hypotension (Lying 130/85 > 117/80 standing, improved to 123/95 at 3 minutes).  Encouraged to stay hydrated and trial compression stockings  CAD: Reported atypical chest pain.  Coronary CTA on 04/06/2021 showed mild nonobstructive CAD, calcium score 83 (83rd percentile).  Echo on 04/27/2021 showed EF 45 to 40%, grade 1 diastolic dysfunction, normal RV function, no significant valvular disease.   -Continue atorvastatin 80 mg daily  Hyperlipidemia: On simvastatin 20 mg daily.  LDL 127 on 01/19/2021.  Calcium score 83 (  83rd percentile) on 04/06/2021.  Switched to atorvastatin 40 mg daily.  LDL 97 on 06/21/2021.  Atorvastatin increased to 80 mg daily.  LDL 52 on 03/06/2022   RTC in 6 months   Medication Adjustments/Labs and Tests Ordered: Current medicines are reviewed at length with the patient today.  Concerns regarding medicines are outlined above.  Orders Placed This Encounter  Procedures   ECHOCARDIOGRAM COMPLETE    No orders of the defined types were placed in this encounter.    Patient Instructions  Medication Instructions:  Your physician recommends that you continue on your current medications as directed. Please refer to the Current Medication list given to you today.  *If you need a refill on your cardiac medications before your next appointment, please call your pharmacy*  Testing/Procedures: Your physician has requested that you have an echocardiogram. Echocardiography is a painless test that uses sound waves to create images of your heart. It provides your doctor with information about the size and shape of your heart and how well your heart's chambers and valves are working. This procedure takes approximately one hour. There are no restrictions for this procedure. Please do NOT wear cologne, perfume, aftershave, or lotions (deodorant is allowed). Please arrive 15 minutes prior to your appointment  time.  Follow-Up: At [facility], you and your health needs are our priority.  As part of our continuing mission to provide you with exceptional heart care, we have created designated Provider Care Teams.  These Care Teams include your primary Cardiologist (physician) and Advanced Practice Providers (APPs -  Physician Assistants and Nurse Practitioners) who all work together to provide you with the care you need, when you need it.  We recommend signing up for the patient portal called "MyChart".  Sign up information is provided on this After Visit Summary.  MyChart is used to connect with patients for Virtual Visits (Telemedicine).  Patients are able to view lab/test results, encounter notes, upcoming appointments, etc.  Non-urgent messages can be sent to your provider as well.   To learn more about what you can do with MyChart, go to [email or web address].    Your next appointment:   6 month(s)  Provider:   Donato Heinz, MD     Other Instructions Please check your blood pressure at home twice daily, write it down.  Call the office or send message via Mychart with the readings in 1 week for Dr. Gardiner Rhyme to review.      Signed, James Heinz, MD  07/21/2022 9:06 AM    [place]

## 2022-07-21 ENCOUNTER — Ambulatory Visit: Payer: Medicaid Other | Attending: Cardiology | Admitting: Cardiology

## 2022-07-21 ENCOUNTER — Encounter: Payer: Self-pay | Admitting: Cardiology

## 2022-07-21 VITALS — BP 116/74 | HR 79 | Ht 70.0 in | Wt 164.0 lb

## 2022-07-21 DIAGNOSIS — I2510 Atherosclerotic heart disease of native coronary artery without angina pectoris: Secondary | ICD-10-CM

## 2022-07-21 DIAGNOSIS — I5022 Chronic systolic (congestive) heart failure: Secondary | ICD-10-CM

## 2022-07-21 DIAGNOSIS — E785 Hyperlipidemia, unspecified: Secondary | ICD-10-CM

## 2022-07-21 DIAGNOSIS — R55 Syncope and collapse: Secondary | ICD-10-CM | POA: Diagnosis not present

## 2022-07-21 NOTE — Patient Instructions (Signed)
Medication Instructions:  Your physician recommends that you continue on your current medications as directed. Please refer to the Current Medication list given to you today.  *If you need a refill on your cardiac medications before your next appointment, please call your pharmacy*  Testing/Procedures: Your physician has requested that you have an echocardiogram. Echocardiography is a painless test that uses sound waves to create images of your heart. It provides your doctor with information about the size and shape of your heart and how well your heart's chambers and valves are working. This procedure takes approximately one hour. There are no restrictions for this procedure. Please do NOT wear cologne, perfume, aftershave, or lotions (deodorant is allowed). Please arrive 15 minutes prior to your appointment time.  Follow-Up: At [facility], you and your health needs are our priority.  As part of our continuing mission to provide you with exceptional heart care, we have created designated Provider Care Teams.  These Care Teams include your primary Cardiologist (physician) and Advanced Practice Providers (APPs -  Physician Assistants and Nurse Practitioners) who all work together to provide you with the care you need, when you need it.  We recommend signing up for the patient portal called "MyChart".  Sign up information is provided on this After Visit Summary.  MyChart is used to connect with patients for Virtual Visits (Telemedicine).  Patients are able to view lab/test results, encounter notes, upcoming appointments, etc.  Non-urgent messages can be sent to your provider as well.   To learn more about what you can do with MyChart, go to [email or web address].    Your next appointment:   6 month(s)  Provider:   Donato Heinz, MD     Other Instructions Please check your blood pressure at home twice daily, write it down.  Call the office or send message via Mychart with  the readings in 1 week for Dr. Gardiner Rhyme to review.

## 2022-07-31 ENCOUNTER — Ambulatory Visit (HOSPITAL_COMMUNITY): Payer: Medicaid Other | Attending: Cardiology

## 2022-07-31 DIAGNOSIS — I5022 Chronic systolic (congestive) heart failure: Secondary | ICD-10-CM | POA: Diagnosis not present

## 2022-07-31 LAB — ECHOCARDIOGRAM COMPLETE
Area-P 1/2: 4.36 cm2
Calc EF: 63.9 %
Est EF: 55
S' Lateral: 2.7 cm
Single Plane A2C EF: 66.4 %
Single Plane A4C EF: 62 %

## 2022-08-22 ENCOUNTER — Other Ambulatory Visit: Payer: Self-pay | Admitting: Cardiology

## 2022-08-30 ENCOUNTER — Other Ambulatory Visit: Payer: Self-pay | Admitting: Physician Assistant

## 2022-08-30 DIAGNOSIS — Z87891 Personal history of nicotine dependence: Secondary | ICD-10-CM

## 2022-09-18 ENCOUNTER — Other Ambulatory Visit: Payer: Self-pay | Admitting: Cardiology

## 2022-10-04 IMAGING — MR MR CARD MORPHOLOGY WO/W CM
45 of 48 series · 45 of 48 positions shown · IV contrast (Contrast agent)
Comparison: none

CLINICAL DATA: Cardiomyopathy of uncertain etiology

EXAM:
CARDIAC MRI
TECHNIQUE: The patient was scanned on a 1.5 Tesla GE magnet. A dedicated
cardiac coil was used. Functional imaging was done using Fiesta
sequences. [DATE], and 4 chamber views were done to assess for RWMA's.
Modified Jumper rule using a short axis stack was used to
calculate an ejection fraction on a dedicated work station using
Circle software. The patient received 8 cc of Gadavist. After 10
minutes inversion recovery sequences were used to assess for
infiltration and scar tissue.

[Series 4: [id number] · axial · 8.0mm · 1.41mm/px · 1 of 20 slices shown]
[im 1/20]
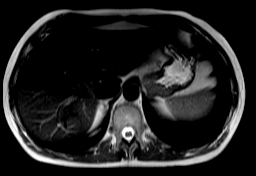

[Series 8: bSSFP · oblique · 8.0mm · 1.61mm/px · 1 of 25 slices shown (1 of 36)]
[im 1/25]
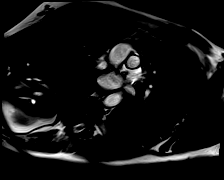

[Series 9: bSSFP · oblique · 8.0mm · 1.61mm/px · 1 of 25 slices shown (2 of 36)]
[im 1/25]
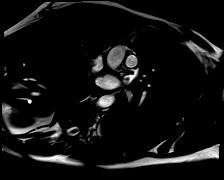

[Series 10: bSSFP · oblique · 8.0mm · 1.61mm/px · 1 of 25 slices shown (3 of 36)]
[im 1/25]
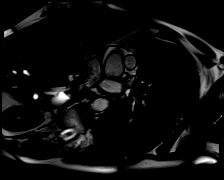

[Series 11: bSSFP · oblique · 8.0mm · 1.61mm/px · 1 of 25 slices shown (4 of 36)]
[im 1/25]
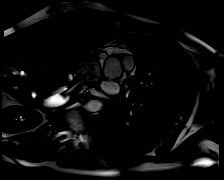

[Series 12: bSSFP · oblique · 8.0mm · 1.61mm/px · 1 of 25 slices shown (5 of 36)]
[im 1/25]
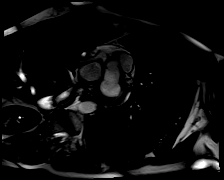

[Series 13: bSSFP · oblique · 8.0mm · 1.61mm/px · 1 of 25 slices shown (6 of 36)]
[im 1/25]
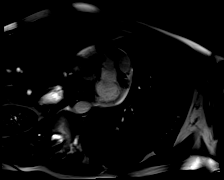

[Series 14: bSSFP · oblique · 8.0mm · 1.61mm/px · 1 of 25 slices shown (7 of 36)]
[im 1/25]
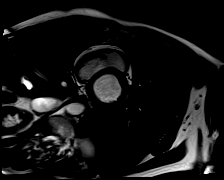

[Series 15: bSSFP · oblique · 8.0mm · 1.61mm/px · 1 of 25 slices shown (8 of 36)]
[im 1/25]
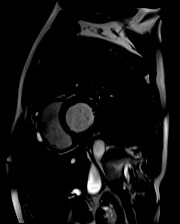

[Series 16: bSSFP · oblique · 8.0mm · 1.61mm/px · 1 of 25 slices shown (9 of 36)]
[im 1/25]
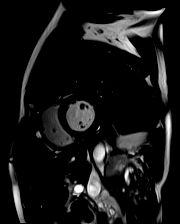

[Series 17: bSSFP · oblique · 8.0mm · 1.61mm/px · 1 of 25 slices shown (10 of 36)]
[im 1/25]
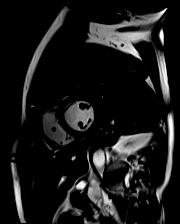

[Series 18: bSSFP · oblique · 8.0mm · 1.61mm/px · 1 of 25 slices shown (11 of 36)]
[im 1/25]
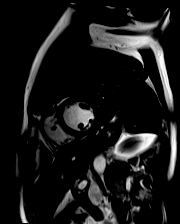

[Series 19: bSSFP · oblique · 8.0mm · 1.61mm/px · 1 of 25 slices shown (12 of 36)]
[im 1/25]
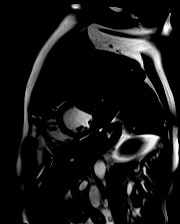

[Series 20: bSSFP · oblique · 8.0mm · 1.61mm/px · 1 of 25 slices shown (13 of 36)]
[im 1/25]
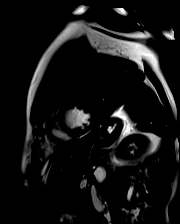

[Series 21: bSSFP · oblique · 8.0mm · 1.61mm/px · 1 of 25 slices shown (14 of 36)]
[im 1/25]
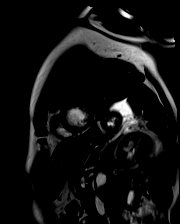

[Series 22: bSSFP · oblique · 8.0mm · 1.61mm/px · 1 of 25 slices shown (15 of 36)]
[im 1/25]
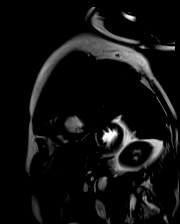

[Series 23: bSSFP · oblique · 8.0mm · 1.61mm/px · 1 of 25 slices shown (16 of 36)]
[im 1/25]
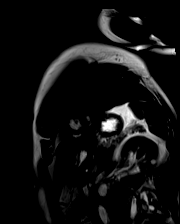

[Series 24: bSSFP · oblique · 8.0mm · 1.61mm/px · 1 of 25 slices shown (17 of 36)]
[im 1/25]
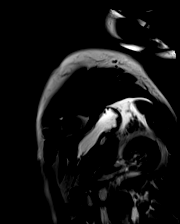

[Series 25: bSSFP · oblique · 8.0mm · 1.61mm/px · 1 of 25 slices shown (18 of 36)]
[im 1/25]
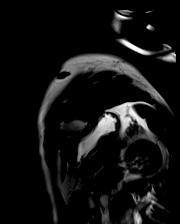

[Series 26: bSSFP · oblique · 8.0mm · 1.61mm/px · 1 of 25 slices shown (19 of 36)]
[im 1/25]
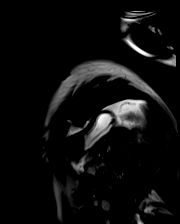

[Series 27: bSSFP · oblique · 8.0mm · 1.61mm/px · 1 of 25 slices shown (20 of 36)]
[im 1/25]
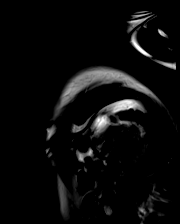

[Series 28: bSSFP · oblique · 6.0mm · 1.61mm/px · 1 of 25 slices shown (21 of 36)]
[im 1/25]
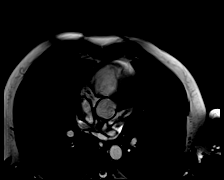

[Series 28: bSSFP · oblique · 6.0mm · 1.61mm/px · 1 of 25 slices shown (22 of 36)]
[im 1/25]
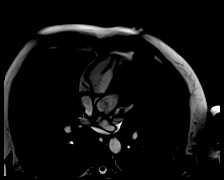

[Series 28: bSSFP · oblique · 6.0mm · 1.61mm/px · 1 of 25 slices shown (23 of 36)]
[im 1/25]
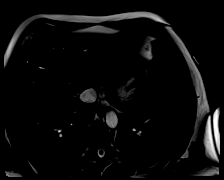

[Series 28: bSSFP · oblique · 6.0mm · 1.61mm/px · 1 of 25 slices shown (24 of 36)]
[im 1/25]
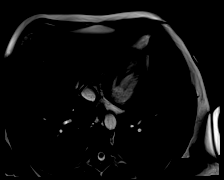

[Series 28: bSSFP · oblique · 6.0mm · 1.61mm/px · 1 of 25 slices shown (25 of 36)]
[im 1/25]
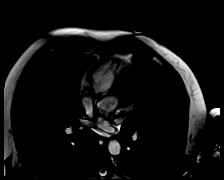

[Series 28: bSSFP · oblique · 6.0mm · 1.61mm/px · 1 of 25 slices shown (26 of 36)]
[im 1/25]
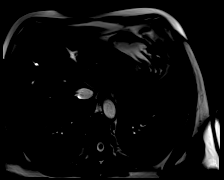

[Series 28: bSSFP · oblique · 6.0mm · 1.61mm/px · 1 of 25 slices shown (27 of 36)]
[im 1/25]
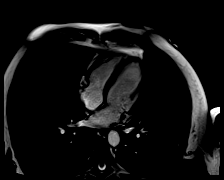

[Series 28: bSSFP · oblique · 6.0mm · 1.61mm/px · 1 of 25 slices shown (28 of 36)]
[im 1/25]
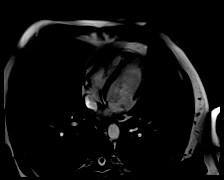

[Series 28: bSSFP · oblique · 6.0mm · 1.61mm/px · 1 of 25 slices shown (29 of 36)]
[im 1/25]
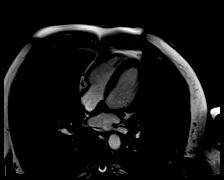

[Series 28: bSSFP · oblique · 6.0mm · 1.61mm/px · 1 of 25 slices shown (30 of 36)]
[im 1/25]
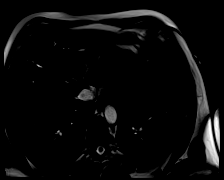

[Series 28: bSSFP · oblique · 6.0mm · 1.61mm/px · 1 of 25 slices shown (31 of 36)]
[im 1/25]
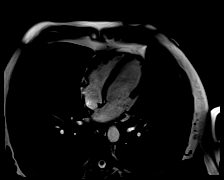

[Series 28: bSSFP · oblique · 6.0mm · 1.61mm/px · 1 of 25 slices shown (32 of 36)]
[im 1/25]
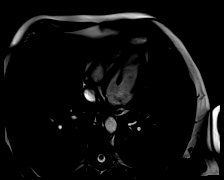

[Series 29: (id)_long_t1 · oblique · 8.0mm · 1.56mm/px · 1 of 24 slices shown]
[im 1/24]
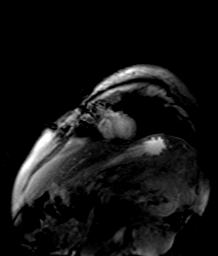

[Series 30: (id)_long_t1_moco · oblique · 8.0mm · 1.56mm/px · 1 of 24 slices shown]
[im 1/24]
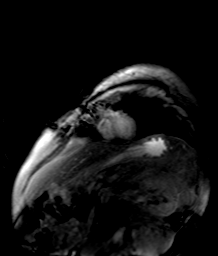

[Series 31: (id)_long_t1_moco_t1 · oblique · 8.0mm · 1.56mm/px · 1 of 6 slices shown]
[im 1/6]
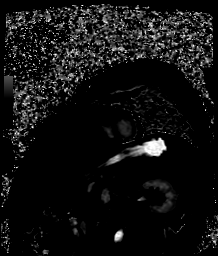

[Series 33: (id)_trufi · oblique · 8.0mm · 2.08mm/px · 1 of 9 slices shown]
[im 1/9]
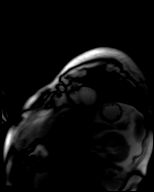

[Series 34: (id)_trufi_moco · oblique · 8.0mm · 2.08mm/px · 1 of 9 slices shown]
[im 1/9]
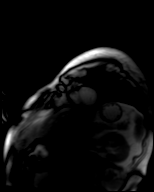

[Series 35: (id)_trufi_moco_t2 · oblique · 8.0mm · 2.08mm/px · 1 of 3 slices shown]
[im 1/3]
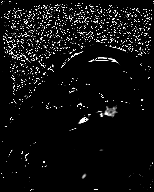

[Series 37: bSSFP · oblique · 6.0mm · 1.41mm/px · 1 of 25 slices shown (33 of 36)]
[im 1/25]
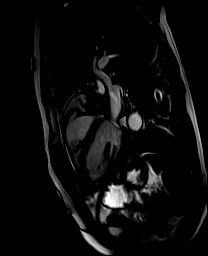

[Series 38: bSSFP · oblique · 6.0mm · 1.41mm/px · 1 of 25 slices shown (34 of 36)]
[im 1/25]
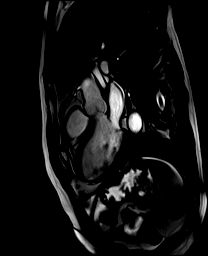

[Series 39: bSSFP · sagittal · 6.0mm · 1.41mm/px · 1 of 25 slices shown (35 of 36)]
[im 1/25]
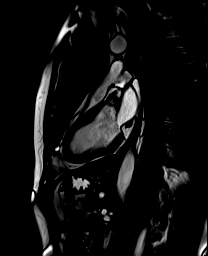

[Series 40: bSSFP · oblique · 6.0mm · 1.41mm/px · 1 of 25 slices shown (36 of 36)]
[im 1/25]
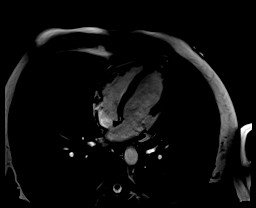

[Series 41: pre short axis · oblique · non-contrast · 8.0mm · 2.25mm/px · 1 of 10 slices shown (1 of 2)]
[im 1/10]
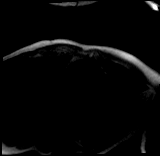

[Series 42: pre short axis · oblique · non-contrast · 8.0mm · 2.25mm/px · 1 of 10 slices shown (2 of 2)]
[im 1/10]
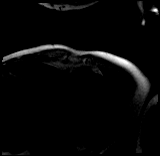

[45 of 48 positions shown; findings below may reference images not displayed]

FINDINGS: Limited images of the lung fields showed no gross abnormalities.

Normal left ventricular size and wall thickness. No regional wall
motion abnormalities, low normal to mildly reduced LV EF 54%. Normal
right ventricular size and systolic function, EF 52%. Normal left
and right atrial sizes. Trileaflet aortic valve with no significant
stenosis or regurgitation. No significant mitral regurgitation
noted.

Normal first pass perfusion images.

On delayed enhancement imaging, there was no myocardial late
gadolinium enhancement (LGE).

MEASUREMENTS:
MEASUREMENTS
LVEDV 133 mL

LVSV 72 mL
LVEF 54%

RVEDV 98 mL
RVSV 51 mL
RVEF 52%

T1 997, ECV 29% (septum)
IMPRESSION: 1. Normal LV size and wall motion with low normal to mildly
decreased systolic function, EF 54%.

2.  Normal RV size and systolic function, EF 52%.

3. No myocardial LGE, so no definitive evidence for prior MI,
infiltrative disease, or myocarditis.

4.  Normal T1 indices.

Nannim Marcus

## 2022-10-11 ENCOUNTER — Ambulatory Visit
Admission: RE | Admit: 2022-10-11 | Discharge: 2022-10-11 | Disposition: A | Discharge status: Home / Self Care | Payer: Medicaid Other | Source: Ambulatory Visit | Attending: Physician Assistant | Admitting: Physician Assistant

## 2022-10-11 DIAGNOSIS — Z87891 Personal history of nicotine dependence: Secondary | ICD-10-CM

## 2022-12-19 ENCOUNTER — Other Ambulatory Visit: Payer: Self-pay | Admitting: Cardiology

## 2023-01-21 NOTE — Progress Notes (Deleted)
Cardiology Office Note:    Date:  01/21/2023   ID:  James Dudley, DOB [birth year], MRN [id number]  PCP:  Practice, [facility]  Cardiologist:  Little Ishikawa, MD  Electrophysiologist:  None   Referring MD: Practice, [facility]*   No chief complaint on file.   History of Present Illness:    James Dudley is a 52 y.o. male with a hx of hyperlipidemia who presents for follow-up.  He was referred by Lonie Peak, PA for evaluation of shortness of breath and chest pain, initially seen on 03/28/2021.  He reports he has been having shortness of breath for the past year.  States that he is short of breath with minimal exertion.  Also has chest pain about once per month.  Describes as sharp pain on left side of chest.  Does not notice what brings it on.  Also gets diaphoretic during episodes of shortness of breath and feels lightheaded, but no syncope.  Denies any palpitations or lower extremity edema.  He had COVID-19 infection in March 2022 and lost 30 pounds.  Has smoked 2 packs/day for over 30 years, quit at age 61.  Family history includes father has had 5 PCIs, first in late 40s/early 21s.  Coronary CTA on 04/06/2021 showed mild nonobstructive CAD, calcium score 83 (83rd percentile).  Echo on 04/27/2021 showed EF 45 to 50%, grade 1 diastolic dysfunction, normal RV function, no significant valvular disease.  Cardiac MRI on 06/08/2021 showed LVEF 54%, RVEF 52%, no LGE.  Zio patch x14 days on 07/18/2021 showed no significant arrhythmias.  Echocardiogram 07/2022 showed EF 55%, normal RV function, no significant valvular disease.  Since last clinic visit,  he reports he has been doing okay.  Had some low blood pressures in February felt lightheaded but recently states BP has been normal.  Denies any chest pain, dyspnea, lightheadedness, syncope, lower extremity edema, or palpitations.   Past Medical History:  Diagnosis Date   Back injury    Collapsed lung    Hyperlipemia     Sciatic pain     Past Surgical History:  Procedure Laterality Date   BACK SURGERY     LUNG SURGERY     LUNG SURGERY     atalectasis    Current Medications: No outpatient medications have been marked as taking for the 01/22/23 encounter (Appointment) with Little Ishikawa, MD.     Allergies:   Other, Codeine, Penicillins, and Tramadol   Social History   Socioeconomic History   Marital status: Single    Spouse name: Not on file   Number of children: Not on file   Years of education: Not on file   Highest education level: Not on file  Occupational History   Not on file  Tobacco Use   Smoking status: Former    Current packs/day: 1.00    Average packs/day: 1 pack/day for 25.0 years (25.0 ttl pk-yrs)    Types: Cigarettes   Smokeless tobacco: Never  Substance and Sexual Activity   Alcohol use: No   Drug use: No   Sexual activity: Not on file  Other Topics Concern   Not on file  Social History Narrative   Not on file   Social Determinants of Health   Financial Resource Strain: Not on file  Food Insecurity: Not on file  Transportation Needs: Not on file  Physical Activity: Not on file  Stress: Not on file  Social Connections: Unknown (09/23/2021)   Received from [facility]   Social Network    Social Network: Not on file     Family History: The patient's family history includes Diabetes in his mother; Hypertension in an other family member.  ROS:   Please see the history of present illness.     All other systems reviewed and are negative.  EKGs/Labs/Other Studies Reviewed:    The following studies were reviewed today:   EKG:   03/28/21: normal sinus rhythm, rate 83, no ST abnormalities 03/06/22: Normal sinus rhythm, rate 91, PACs  Recent Labs: 03/06/2022: BUN 13; Creatinine, Ser 0.99; Potassium 4.7; Sodium 141  Recent Lipid Panel    Component Value Date/Time   CHOL 126 03/06/2022 0921   TRIG 60 03/06/2022 0921   HDL 61  03/06/2022 0921   CHOLHDL 2.1 03/06/2022 0921   CHOLHDL 5.6 Ratio 02/11/2010 2147   VLDL 43 (H) 02/11/2010 2147   LDLCALC 52 03/06/2022 0921    Physical Exam:    VS:  There were no vitals taken for this visit.    Wt Readings from Last 3 Encounters:  07/21/22 164 lb (74.4 kg)  03/06/22 167 lb (75.8 kg)  09/19/21 175 lb (79.4 kg)     GEN:  Well nourished, well developed in no acute distress HEENT: Normal NECK: No JVD; No carotid bruits CARDIAC: RRR, no murmurs, rubs, gallops RESPIRATORY:  Clear to auscultation without rales, wheezing or rhonchi  ABDOMEN: Soft, non-tender, non-distended MUSCULOSKELETAL:  No edema; No deformity  SKIN: Warm and dry NEUROLOGIC:  Alert and oriented x 3 PSYCHIATRIC:  Normal affect   ASSESSMENT:    No diagnosis found.    PLAN:    Heart failure with improved EF: EF 45 to 50% on echo 04/27/2021.  Nonobstructive CAD on coronary CTA 04/06/2021.  Cardiac MRI on 06/08/2021 showed LVEF 54%, RVEF 52%, no LGE.  Appears euvolemic.  Echocardiogram 07/2022 showed EF 55%, normal RV function, no significant valvular disease. -Continue losartan 25 mg daily -Continue Toprol-XL 25 mg daily  Syncope: Description concerning for arrhythmia given lack of prodromal symptoms.  Zio patch x14 days on 07/18/2021 showed no significant arrhythmias. -Denies any recent syncope but reported lightheadedness.  Orthostatics at prior clinic visit showed mild drop in BP with standing but not meeting criteria for orthostatic hypotension (Lying 130/85 > 117/80 standing, improved to 123/95 at 3 minutes).  Encouraged to stay hydrated and trial compression stockings  CAD: Reported atypical chest pain.  Coronary CTA on 04/06/2021 showed mild nonobstructive CAD, calcium score 83 (83rd percentile).  Echo on 04/27/2021 showed EF 45 to 50%, grade 1 diastolic dysfunction, normal RV function, no significant valvular disease.   -Continue atorvastatin 80 mg daily  Hyperlipidemia: On simvastatin 20  mg daily.  LDL 127 on 01/19/2021.  Calcium score 83 (83rd percentile) on 04/06/2021.  Switched to atorvastatin 40 mg daily.  LDL 97 on 06/21/2021.  Atorvastatin increased to 80 mg daily.  LDL 52 on 03/06/2022   RTC in 6 months   Medication Adjustments/Labs and Tests Ordered: Current medicines are reviewed at length with the patient today.  Concerns regarding medicines are outlined above.  No orders of the defined types were placed in this encounter.   No orders of the defined types were placed in this encounter.    There are no Patient Instructions on file for this visit.   Signed, Little Ishikawa, MD  01/21/2023 3:49 PM    [facility]

## 2023-01-22 ENCOUNTER — Ambulatory Visit: Payer: Medicaid Other | Admitting: Cardiology

## 2023-01-22 ENCOUNTER — Encounter: Payer: Self-pay | Admitting: Cardiology

## 2023-01-22 ENCOUNTER — Ambulatory Visit: Payer: Medicaid Other | Attending: Cardiology | Admitting: Cardiology

## 2023-01-22 VITALS — BP 116/74 | HR 74 | Ht 70.0 in | Wt 166.6 lb

## 2023-01-22 DIAGNOSIS — I5032 Chronic diastolic (congestive) heart failure: Secondary | ICD-10-CM | POA: Diagnosis not present

## 2023-01-22 DIAGNOSIS — E785 Hyperlipidemia, unspecified: Secondary | ICD-10-CM

## 2023-01-22 DIAGNOSIS — R55 Syncope and collapse: Secondary | ICD-10-CM

## 2023-01-22 DIAGNOSIS — I2510 Atherosclerotic heart disease of native coronary artery without angina pectoris: Secondary | ICD-10-CM

## 2023-01-22 NOTE — Progress Notes (Signed)
Cardiology Office Note:    Date:  01/22/2023   ID:  [name], DOB [birth year], MRN [id number]  PCP:  Practice, [facility]  Cardiologist:  Little Ishikawa, MD  Electrophysiologist:  None   Referring MD: Practice, Duanne Limerick*   Chief Complaint  Patient presents with   Congestive Heart Failure    History of Present Illness:    James Dudley is a 52 y.o. male with a hx of hyperlipidemia who presents for follow-up.  He was referred by Lonie Peak, PA for evaluation of shortness of breath and chest pain, initially seen on 03/28/2021.  He reports he has been having shortness of breath for the past year.  States that he is short of breath with minimal exertion.  Also has chest pain about once per month.  Describes as sharp pain on left side of chest.  Does not notice what brings it on.  Also gets diaphoretic during episodes of shortness of breath and feels lightheaded, but no syncope.  Denies any palpitations or lower extremity edema.  He had COVID-19 infection in March 2022 and lost 30 pounds.  Has smoked 2 packs/day for over 30 years, quit at age 31.  Family history includes father has had 5 PCIs, first in late 40s/early 90s.  Coronary CTA on 04/06/2021 showed mild nonobstructive CAD, calcium score 83 (83rd percentile).  Echo on 04/27/2021 showed EF 45 to 50%, grade 1 diastolic dysfunction, normal RV function, no significant valvular disease.  Cardiac MRI on 06/08/2021 showed LVEF 54%, RVEF 52%, no LGE.  Zio patch x14 days on 07/18/2021 showed no significant arrhythmias.  Echocardiogram 07/2022 showed EF 55%, normal RV function, no significant valvular disease.  Since last clinic visit, he reports he is doing well.  Denies any chest pain, dyspnea, lower extremity edema, or palpitations.  Reports some lightheadedness but denies any syncope.  He walks every day for 30 to 45 minutes, denies any exertional symptoms.  Past Medical History:  Diagnosis Date   Back injury     Collapsed lung    Hyperlipemia    Sciatic pain     Past Surgical History:  Procedure Laterality Date   BACK SURGERY     LUNG SURGERY     LUNG SURGERY     atalectasis    Current Medications: Current Meds  Medication Sig   AMITRIPTYLINE HCL PO Take 100 mg by mouth daily.   atorvastatin (LIPITOR) 80 MG tablet TAKE 1 TABLET BY MOUTH DAILY   hydrOXYzine (ATARAX) 25 MG tablet Take 25-50 mg by mouth every 8 (eight) hours as needed.   losartan (COZAAR) 25 MG tablet TAKE 1 TABLET BY MOUTH DAILY   metoprolol succinate (TOPROL-XL) 25 MG 24 hr tablet TAKE 1 TABLET BY MOUTH DAILY     Allergies:   Other, Codeine, Penicillins, and Tramadol   Social History   Socioeconomic History   Marital status: Single    Spouse name: Not on file   Number of children: Not on file   Years of education: Not on file   Highest education level: Not on file  Occupational History   Not on file  Tobacco Use   Smoking status: Former    Current packs/day: 1.00    Average packs/day: 1 pack/day for 25.0 years (25.0 ttl pk-yrs)    Types: Cigarettes   Smokeless tobacco: Never  Substance and Sexual Activity   Alcohol use: No   Drug use: No   Sexual activity: Not on file  Other  Topics Concern   Not on file  Social History Narrative   Not on file   Social Determinants of Health   Financial Resource Strain: Not on file  Food Insecurity: Not on file  Transportation Needs: Not on file  Physical Activity: Not on file  Stress: Not on file  Social Connections: Unknown (09/23/2021)   Received from [facility]   Social Network    Social Network: Not on file     Family History: The patient's family history includes Diabetes in his mother; Hypertension in an other family member.  ROS:   Please see the history of present illness.     All other systems reviewed and are negative.  EKGs/Labs/Other Studies Reviewed:    The following studies were reviewed today:   EKG:   03/28/21: normal  sinus rhythm, rate 83, no ST abnormalities 03/06/22: Normal sinus rhythm, rate 91, PACs 01/22/2023: Normal sinus rhythm, rate 74, no ST abnormalities  Recent Labs: 03/06/2022: BUN 13; Creatinine, Ser 0.99; Potassium 4.7; Sodium 141  Recent Lipid Panel    Component Value Date/Time   CHOL 126 03/06/2022 0921   TRIG 60 03/06/2022 0921   HDL 61 03/06/2022 0921   CHOLHDL 2.1 03/06/2022 0921   CHOLHDL 5.6 Ratio 02/11/2010 2147   VLDL 43 (H) 02/11/2010 2147   LDLCALC 52 03/06/2022 0921    Physical Exam:    VS:  BP 116/74   Pulse 74   Ht 5\' 10"  (1.778 m)   Wt 166 lb 9.6 oz (75.6 kg)   SpO2 100%   BMI 23.90 kg/m     Wt Readings from Last 3 Encounters:  01/22/23 166 lb 9.6 oz (75.6 kg)  07/21/22 164 lb (74.4 kg)  03/06/22 167 lb (75.8 kg)     GEN:  Well nourished, well developed in no acute distress HEENT: Normal NECK: No JVD; No carotid bruits CARDIAC: RRR, no murmurs, rubs, gallops RESPIRATORY:  Clear to auscultation without rales, wheezing or rhonchi  ABDOMEN: Soft, non-tender, non-distended MUSCULOSKELETAL:  No edema; No deformity  SKIN: Warm and dry NEUROLOGIC:  Alert and oriented x 3 PSYCHIATRIC:  Normal affect   ASSESSMENT:    1. Heart failure with improved ejection fraction (HFimpEF) (HCC)   2. Syncope and collapse   3. Coronary artery disease involving native coronary artery of native heart without angina pectoris   4. Hyperlipidemia, unspecified hyperlipidemia type     PLAN:    Heart failure with improved EF: EF 45 to 50% on echo 04/27/2021.  Nonobstructive CAD on coronary CTA 04/06/2021.  Cardiac MRI on 06/08/2021 showed LVEF 54%, RVEF 52%, no LGE.  Appears euvolemic.  Echocardiogram 07/2022 showed EF 55%, normal RV function, no significant valvular disease. -Continue losartan 25 mg daily -Continue Toprol-XL 25 mg daily  Syncope: Description concerning for arrhythmia given lack of prodromal symptoms.  Zio patch x14 days on 07/18/2021 showed no significant  arrhythmias. -Denies any recent syncope but reported lightheadedness.  Orthostatics at prior clinic visit showed mild drop in BP with standing but not meeting criteria for orthostatic hypotension (Lying 130/85 > 117/80 standing, improved to 123/95 at 3 minutes).  Encouraged to stay hydrated and trial compression stockings  CAD: Reported atypical chest pain.  Coronary CTA on 04/06/2021 showed mild nonobstructive CAD, calcium score 83 (83rd percentile).  Echo on 04/27/2021 showed EF 45 to 50%, grade 1 diastolic dysfunction, normal RV function, no significant valvular disease.   -Continue atorvastatin 80 mg daily  Hyperlipidemia: On simvastatin 20 mg  daily.  LDL 127 on 01/19/2021.  Calcium score 83 (83rd percentile) on 04/06/2021.  Switched to atorvastatin 40 mg daily.  LDL 97 on 06/21/2021.  Atorvastatin increased to 80 mg daily.  LDL 44 on 08/30/22   RTC in 6 months   Medication Adjustments/Labs and Tests Ordered: Current medicines are reviewed at length with the patient today.  Concerns regarding medicines are outlined above.  Orders Placed This Encounter  Procedures   EKG 12-Lead    No orders of the defined types were placed in this encounter.    Patient Instructions  Medication Instructions:  No Changes *If you need a refill on your cardiac medications before your next appointment, please call your pharmacy*   Lab Work: No Labs If you have labs (blood work) drawn today and your tests are completely normal, you will receive your results only by: MyChart Message (if you have MyChart) OR A paper copy in the mail If you have any lab test that is abnormal or we need to change your treatment, we will call you to review the results.   Testing/Procedures: No Testing   Follow-Up: At [facility], you and your health needs are our priority.  As part of our continuing mission to provide you with exceptional heart care, we have created designated Provider Care Teams.  These Care  Teams include your primary Cardiologist (physician) and Advanced Practice Providers (APPs -  Physician Assistants and Nurse Practitioners) who all work together to provide you with the care you need, when you need it.  We recommend signing up for the patient portal called "MyChart".  Sign up information is provided on this After Visit Summary.  MyChart is used to connect with patients for Virtual Visits (Telemedicine).  Patients are able to view lab/test results, encounter notes, upcoming appointments, etc.  Non-urgent messages can be sent to your provider as well.   To learn more about what you can do with MyChart, go to [email or web address].    Your next appointment:   6 month(s)  Provider:   Little Ishikawa, MD    Signed, Little Ishikawa, MD  01/22/2023 1:20 PM    [facility]

## 2023-01-22 NOTE — Patient Instructions (Signed)
Medication Instructions:  No Changes *If you need a refill on your cardiac medications before your next appointment, please call your pharmacy*   Lab Work: No Labs If you have labs (blood work) drawn today and your tests are completely normal, you will receive your results only by: MyChart Message (if you have MyChart) OR A paper copy in the mail If you have any lab test that is abnormal or we need to change your treatment, we will call you to review the results.   Testing/Procedures: No Testing   Follow-Up: At [facility], you and your health needs are our priority.  As part of our continuing mission to provide you with exceptional heart care, we have created designated Provider Care Teams.  These Care Teams include your primary Cardiologist (physician) and Advanced Practice Providers (APPs -  Physician Assistants and Nurse Practitioners) who all work together to provide you with the care you need, when you need it.  We recommend signing up for the patient portal called "MyChart".  Sign up information is provided on this After Visit Summary.  MyChart is used to connect with patients for Virtual Visits (Telemedicine).  Patients are able to view lab/test results, encounter notes, upcoming appointments, etc.  Non-urgent messages can be sent to your provider as well.   To learn more about what you can do with MyChart, go to [email or web address].    Your next appointment:   6 month(s)  Provider:   Little Ishikawa, MD

## 2023-03-20 ENCOUNTER — Other Ambulatory Visit: Payer: Self-pay | Admitting: Cardiology

## 2023-04-20 ENCOUNTER — Telehealth: Payer: Self-pay

## 2023-04-20 ENCOUNTER — Telehealth: Payer: Self-pay | Admitting: Cardiology

## 2023-04-20 NOTE — Telephone Encounter (Signed)
Spoke with patient is agreeable to do a tele visit on 1/13 at 9:40. Consent given, med rec to be completed.

## 2023-04-20 NOTE — Telephone Encounter (Signed)
  Patient Consent for Virtual Visit        James Dudley has provided verbal consent on 04/20/2023 for a virtual visit (video or telephone).   CONSENT FOR VIRTUAL VISIT FOR:  James Dudley  By participating in this virtual visit I agree to the following:  I hereby voluntarily request, consent and authorize [facility] and its employed or contracted physicians, physician assistants, nurse practitioners or other licensed health care professionals (the Practitioner), to provide me with telemedicine health care services (the "Services") as deemed necessary by the treating Practitioner. I acknowledge and consent to receive the Services by the Practitioner via telemedicine. I understand that the telemedicine visit will involve communicating with the Practitioner through live audiovisual communication technology and the disclosure of certain medical information by electronic transmission. I acknowledge that I have been given the opportunity to request an in-person assessment or other available alternative prior to the telemedicine visit and am voluntarily participating in the telemedicine visit.  I understand that I have the right to withhold or withdraw my consent to the use of telemedicine in the course of my care at any time, without affecting my right to future care or treatment, and that the Practitioner or I may terminate the telemedicine visit at any time. I understand that I have the right to inspect all information obtained and/or recorded in the course of the telemedicine visit and may receive copies of available information for a reasonable fee.  I understand that some of the potential risks of receiving the Services via telemedicine include:  Delay or interruption in medical evaluation due to technological equipment failure or disruption; Information transmitted may not be sufficient (e.g. poor resolution of images) to allow for appropriate medical decision making by the  Practitioner; and/or  In rare instances, security protocols could fail, causing a breach of personal health information.  Furthermore, I acknowledge that it is my responsibility to provide information about my medical history, conditions and care that is complete and accurate to the best of my ability. I acknowledge that Practitioner's advice, recommendations, and/or decision may be based on factors not within their control, such as incomplete or inaccurate data provided by me or distortions of diagnostic images or specimens that may result from electronic transmissions. I understand that the practice of medicine is not an exact science and that Practitioner makes no warranties or guarantees regarding treatment outcomes. I acknowledge that a copy of this consent can be made available to me via my patient portal [facility] MyChart), or I can request a printed copy by calling the office of [facility].    I understand that my insurance will be billed for this visit.   I have read or had this consent read to me. I understand the contents of this consent, which adequately explains the benefits and risks of the Services being provided via telemedicine.  I have been provided ample opportunity to ask questions regarding this consent and the Services and have had my questions answered to my satisfaction. I give my informed consent for the services to be provided through the use of telemedicine in my medical care

## 2023-04-20 NOTE — Telephone Encounter (Signed)
   Pre-operative Risk Assessment  Last visit: 01-22-2023 Next visit: 07-24-2023   Patient Name: James Dudley  DOB: [birth year] MRN: [id number]      Request for Surgical Clearance    Procedure:   colonoscopy  Date of Surgery:  Clearance 06/13/23                                 Surgeon:  Dr. Vida Rigger Surgeon's Group or Practice Name:  [facility] Phone number:  [phone] Fax number:  [phone]   Type of Clearance Requested:   - Medical    Type of Anesthesia:   propofol   Additional requests/questions:    Signed, Royann Shivers   04/20/2023, 9:17 AM

## 2023-04-20 NOTE — Telephone Encounter (Signed)
   Name: James Dudley  DOB: [birth year]  MRN: [id number]  Primary Cardiologist: Little Ishikawa, MD   Preoperative team, please contact this patient and set up a phone call appointment for further preoperative risk assessment. Please obtain consent and complete medication review. Thank you for your help.  I confirm that guidance regarding antiplatelet and oral anticoagulation therapy has been completed and, if necessary, noted below.  I also confirmed the patient resides in the state of [place]. As per [facility] [employer] telemedicine laws, the patient must reside in the state in which the provider is licensed.   Marcelino Duster, PA 04/20/2023, 9:25 AM [facility]

## 2023-04-22 ENCOUNTER — Inpatient Hospital Stay (HOSPITAL_COMMUNITY)
Admission: EM | Admit: 2023-04-22 | Discharge: 2023-04-27 | DRG: 868 | Disposition: A | Discharge status: Home / Self Care | Payer: Medicaid Other | Attending: Internal Medicine | Admitting: Internal Medicine

## 2023-04-22 ENCOUNTER — Encounter (HOSPITAL_COMMUNITY): Payer: Self-pay

## 2023-04-22 ENCOUNTER — Other Ambulatory Visit: Payer: Self-pay

## 2023-04-22 DIAGNOSIS — R197 Diarrhea, unspecified: Secondary | ICD-10-CM | POA: Diagnosis not present

## 2023-04-22 DIAGNOSIS — E876 Hypokalemia: Secondary | ICD-10-CM | POA: Diagnosis present

## 2023-04-22 DIAGNOSIS — I2510 Atherosclerotic heart disease of native coronary artery without angina pectoris: Secondary | ICD-10-CM | POA: Diagnosis present

## 2023-04-22 DIAGNOSIS — A029 Salmonella infection, unspecified: Principal | ICD-10-CM | POA: Diagnosis present

## 2023-04-22 DIAGNOSIS — I959 Hypotension, unspecified: Secondary | ICD-10-CM | POA: Diagnosis present

## 2023-04-22 DIAGNOSIS — Z880 Allergy status to penicillin: Secondary | ICD-10-CM

## 2023-04-22 DIAGNOSIS — D61818 Other pancytopenia: Secondary | ICD-10-CM | POA: Diagnosis present

## 2023-04-22 DIAGNOSIS — R42 Dizziness and giddiness: Secondary | ICD-10-CM

## 2023-04-22 DIAGNOSIS — R195 Other fecal abnormalities: Secondary | ICD-10-CM | POA: Diagnosis present

## 2023-04-22 DIAGNOSIS — I10 Essential (primary) hypertension: Secondary | ICD-10-CM | POA: Diagnosis present

## 2023-04-22 DIAGNOSIS — E785 Hyperlipidemia, unspecified: Secondary | ICD-10-CM | POA: Diagnosis present

## 2023-04-22 DIAGNOSIS — E860 Dehydration: Secondary | ICD-10-CM | POA: Diagnosis present

## 2023-04-22 DIAGNOSIS — Z87891 Personal history of nicotine dependence: Secondary | ICD-10-CM

## 2023-04-22 DIAGNOSIS — Z79899 Other long term (current) drug therapy: Secondary | ICD-10-CM

## 2023-04-22 DIAGNOSIS — Z885 Allergy status to narcotic agent status: Secondary | ICD-10-CM

## 2023-04-22 DIAGNOSIS — E8809 Other disorders of plasma-protein metabolism, not elsewhere classified: Secondary | ICD-10-CM | POA: Diagnosis present

## 2023-04-22 DIAGNOSIS — N179 Acute kidney failure, unspecified: Secondary | ICD-10-CM | POA: Diagnosis present

## 2023-04-22 DIAGNOSIS — R7401 Elevation of levels of liver transaminase levels: Secondary | ICD-10-CM | POA: Diagnosis present

## 2023-04-22 DIAGNOSIS — Z8249 Family history of ischemic heart disease and other diseases of the circulatory system: Secondary | ICD-10-CM

## 2023-04-22 DIAGNOSIS — R339 Retention of urine, unspecified: Secondary | ICD-10-CM | POA: Diagnosis present

## 2023-04-22 DIAGNOSIS — R000 Tachycardia, unspecified: Secondary | ICD-10-CM | POA: Diagnosis present

## 2023-04-22 DIAGNOSIS — E871 Hypo-osmolality and hyponatremia: Secondary | ICD-10-CM | POA: Diagnosis present

## 2023-04-22 DIAGNOSIS — R945 Abnormal results of liver function studies: Secondary | ICD-10-CM | POA: Diagnosis present

## 2023-04-22 LAB — URINALYSIS, ROUTINE W REFLEX MICROSCOPIC
Bacteria, UA: NONE SEEN
Bilirubin Urine: NEGATIVE
Glucose, UA: NEGATIVE mg/dL
Ketones, ur: 5 mg/dL — AB
Leukocytes,Ua: NEGATIVE
Nitrite: NEGATIVE
Protein, ur: 30 mg/dL — AB
Specific Gravity, Urine: 1.021 (ref 1.005–1.030)
pH: 5 (ref 5.0–8.0)

## 2023-04-22 LAB — COMPREHENSIVE METABOLIC PANEL
ALT: 52 U/L — ABNORMAL HIGH (ref 0–44)
AST: 180 U/L — ABNORMAL HIGH (ref 15–41)
Albumin: 4.1 g/dL (ref 3.5–5.0)
Alkaline Phosphatase: 75 U/L (ref 38–126)
Anion gap: 11 (ref 5–15)
BUN: 39 mg/dL — ABNORMAL HIGH (ref 6–20)
CO2: 22 mmol/L (ref 22–32)
Calcium: 8.6 mg/dL — ABNORMAL LOW (ref 8.9–10.3)
Chloride: 95 mmol/L — ABNORMAL LOW (ref 98–111)
Creatinine, Ser: 1.75 mg/dL — ABNORMAL HIGH (ref 0.61–1.24)
GFR, Estimated: 46 mL/min — ABNORMAL LOW (ref >60)
Glucose, Bld: 110 mg/dL — ABNORMAL HIGH (ref 70–99)
Potassium: 3.2 mmol/L — ABNORMAL LOW (ref 3.5–5.1)
Sodium: 128 mmol/L — ABNORMAL LOW (ref 135–145)
Total Bilirubin: 1.4 mg/dL — ABNORMAL HIGH (ref <1.2)
Total Protein: 7.5 g/dL (ref 6.5–8.1)

## 2023-04-22 LAB — CBC
HCT: 43 % (ref 39.0–52.0)
Hemoglobin: 14.1 g/dL (ref 13.0–17.0)
MCH: 29 pg (ref 26.0–34.0)
MCHC: 32.8 g/dL (ref 30.0–36.0)
MCV: 88.3 fL (ref 80.0–100.0)
Platelets: 163 10*3/uL (ref 150–400)
RBC: 4.87 MIL/uL (ref 4.22–5.81)
RDW: 13 % (ref 11.5–15.5)
WBC: 5.3 10*3/uL (ref 4.0–10.5)
nRBC: 0 % (ref 0.0–0.2)

## 2023-04-22 LAB — LIPASE, BLOOD: Lipase: 23 U/L (ref 11–51)

## 2023-04-22 LAB — POC OCCULT BLOOD, ED: Fecal Occult Bld: POSITIVE — AB

## 2023-04-22 MED ORDER — ONDANSETRON HCL 4 MG/2ML IJ SOLN: 4.0000 mg | Freq: Four times a day (QID) | INTRAMUSCULAR | Status: DC | PRN

## 2023-04-22 MED ORDER — ONDANSETRON HCL 4 MG PO TABS: 4.0000 mg | ORAL_TABLET | Freq: Four times a day (QID) | ORAL | Status: DC | PRN

## 2023-04-22 MED ORDER — ACETAMINOPHEN 650 MG RE SUPP: 650.0000 mg | Freq: Four times a day (QID) | RECTAL | Status: DC | PRN

## 2023-04-22 MED ORDER — OXYCODONE HCL 5 MG PO TABS: 5.0000 mg | ORAL_TABLET | ORAL | Status: DC | PRN

## 2023-04-22 MED ORDER — TRAZODONE HCL 50 MG PO TABS: 25.0000 mg | ORAL_TABLET | Freq: Every evening | ORAL | Status: DC | PRN

## 2023-04-22 MED ORDER — ACETAMINOPHEN 325 MG PO TABS
650.0000 mg | ORAL_TABLET | Freq: Four times a day (QID) | ORAL | Status: DC | PRN
  Administered 2023-04-23: 650 mg via ORAL
  Filled 2023-04-22: qty 2

## 2023-04-22 MED ORDER — CIPROFLOXACIN IN D5W 400 MG/200ML IV SOLN
400.0000 mg | Freq: Two times a day (BID) | INTRAVENOUS | Status: DC
  Administered 2023-04-22 – 2023-04-26 (×8): 400 mg via INTRAVENOUS
  Filled 2023-04-22 (×8): qty 200

## 2023-04-22 MED ORDER — ATORVASTATIN CALCIUM 40 MG PO TABS
80.0000 mg | ORAL_TABLET | Freq: Every day | ORAL | Status: DC
  Administered 2023-04-22 – 2023-04-23 (×2): 80 mg via ORAL
  Filled 2023-04-22 (×2): qty 2

## 2023-04-22 MED ORDER — POTASSIUM CHLORIDE CRYS ER 20 MEQ PO TBCR
40.0000 meq | EXTENDED_RELEASE_TABLET | Freq: Once | ORAL | Status: AC
  Administered 2023-04-22: 40 meq via ORAL
  Filled 2023-04-22: qty 2

## 2023-04-22 MED ORDER — SODIUM CHLORIDE 0.9 % IV SOLN
INTRAVENOUS | Status: AC
Start: 2023-04-22 — End: 2023-04-23

## 2023-04-22 MED ORDER — HYDRALAZINE HCL 20 MG/ML IJ SOLN: 5.0000 mg | Freq: Four times a day (QID) | INTRAMUSCULAR | Status: DC | PRN

## 2023-04-22 MED ORDER — ENOXAPARIN SODIUM 40 MG/0.4ML IJ SOSY
40.0000 mg | PREFILLED_SYRINGE | Freq: Every day | INTRAMUSCULAR | Status: DC
  Administered 2023-04-22 – 2023-04-26 (×5): 40 mg via SUBCUTANEOUS
  Filled 2023-04-22 (×5): qty 0.4

## 2023-04-22 MED ORDER — SODIUM CHLORIDE 0.9 % IV BOLUS
1000.0000 mL | Freq: Once | INTRAVENOUS | Status: AC
  Administered 2023-04-22: 1000 mL via INTRAVENOUS

## 2023-04-22 MED ORDER — ALBUTEROL SULFATE (2.5 MG/3ML) 0.083% IN NEBU: 2.5000 mg | INHALATION_SOLUTION | RESPIRATORY_TRACT | Status: DC | PRN

## 2023-04-22 NOTE — ED Provider Notes (Signed)
[place] EMERGENCY DEPARTMENT AT [facility] Provider Note   CSN: [id number] Arrival date & time: 04/22/23  1018     History  Chief Complaint  Patient presents with   Dizziness   Hypertension   Diarrhea    James Dudley is a 52 y.o. male who presents with complaints of dizziness and persistent diarrhea.  He reports his mother, whom he lives with, was seen at this ED last week for similar symptoms, diagnosed with Salmonella.  He is concerned he has the same.  He denies any chest pain, abdominal pain or shortness of breath.  Dizziness is described as lightheadedness and weakness when he tries to walk.  No vomiting but he says he has a bowel movement shortly after anything he eats or drinks.  Denies any alcohol use.  No fevers or chills.  Dizziness Associated symptoms: diarrhea   Hypertension  Diarrhea      Home Medications Prior to Admission medications   Medication Sig Start Date End Date Taking? Authorizing Provider  AMITRIPTYLINE HCL PO Take 100 mg by mouth daily.    [provider]  atorvastatin (LIPITOR) 80 MG tablet TAKE 1 TABLET BY MOUTH DAILY 08/22/22   Little Ishikawa, MD  gabapentin (NEURONTIN) 600 MG tablet Take 600 mg by mouth daily. Patient not taking: Reported on 01/22/2023    [provider]  hydrOXYzine (ATARAX) 25 MG tablet Take 25-50 mg by mouth every 8 (eight) hours as needed. 12/20/22   [provider]  losartan (COZAAR) 25 MG tablet TAKE 1 TABLET BY MOUTH DAILY 03/20/23   Little Ishikawa, MD  metoprolol succinate (TOPROL-XL) 25 MG 24 hr tablet TAKE 1 TABLET BY MOUTH DAILY 12/19/22   Little Ishikawa, MD      Allergies    Other, Codeine, Penicillins, and Tramadol    Review of Systems   Review of Systems  Gastrointestinal:  Positive for diarrhea.  Neurological:  Positive for dizziness.    Physical Exam Updated Vital Signs BP 113/80   Pulse (!) 104   Temp 98.2 F (36.8 C) (Oral)   Resp 16    Ht 5\' 10"  (1.778 m)   Wt 74.8 kg   SpO2 99%   BMI 23.68 kg/m  Physical Exam Vitals and nursing note reviewed.  Constitutional:      General: He is not in acute distress.    Appearance: He is well-developed. He is ill-appearing.  HENT:     Head: Normocephalic and atraumatic.     Mouth/Throat:     Mouth: Mucous membranes are dry.  Eyes:     Conjunctiva/sclera: Conjunctivae normal.  Cardiovascular:     Rate and Rhythm: Normal rate and regular rhythm.     Heart sounds: No murmur heard. Pulmonary:     Effort: Pulmonary effort is normal. No respiratory distress.     Breath sounds: Normal breath sounds.  Abdominal:     General: Bowel sounds are normal.     Palpations: Abdomen is soft.     Tenderness: There is no abdominal tenderness.  Musculoskeletal:        General: No swelling.     Cervical back: Neck supple.  Skin:    General: Skin is warm and dry.     Capillary Refill: Capillary refill takes less than 2 seconds.     Coloration: Skin is jaundiced.  Neurological:     General: No focal deficit present.     Mental Status: He is alert and oriented to  person, place, and time.     Cranial Nerves: No cranial nerve deficit.     Sensory: No sensory deficit.     Motor: No weakness.  Psychiatric:        Mood and Affect: Mood normal.     ED Results / Procedures / Treatments   Labs (all labs ordered are listed, but only abnormal results are displayed) Labs Reviewed  COMPREHENSIVE METABOLIC PANEL - Abnormal; Notable for the following components:      Result Value   Sodium 128 (*)    Potassium 3.2 (*)    Chloride 95 (*)    Glucose, Bld 110 (*)    BUN 39 (*)    Creatinine, Ser 1.75 (*)    Calcium 8.6 (*)    AST 180 (*)    ALT 52 (*)    Total Bilirubin 1.4 (*)    GFR, Estimated 46 (*)    All other components within normal limits  URINALYSIS, ROUTINE W REFLEX MICROSCOPIC - Abnormal; Notable for the following components:   APPearance HAZY (*)    Hgb urine dipstick MODERATE  (*)    Ketones, ur 5 (*)    Protein, ur 30 (*)    All other components within normal limits  POC OCCULT BLOOD, ED - Abnormal; Notable for the following components:   Fecal Occult Bld POSITIVE (*)    All other components within normal limits  GASTROINTESTINAL PANEL BY PCR, STOOL (REPLACES STOOL CULTURE)  LIPASE, BLOOD  CBC  HIV ANTIBODY (ROUTINE TESTING W REFLEX)    EKG EKG Interpretation Date/Time:  Sunday April 22 2023 10:26:28 EST Ventricular Rate:  135 PR Interval:  112 QRS Duration:  96 QT Interval:  397 QTC Calculation: 596 R Axis:   116  Text Interpretation: Sinus tachycardia Consider right atrial enlargement Right axis deviation Prolonged QT interval Baseline wander in lead(s) V2 Confirmed by Alona Bene [phone]) on 04/22/2023 10:30:46 AM  Radiology No results found.  Procedures Procedures    Medications Ordered in ED Medications  sodium chloride 0.9 % bolus 1,000 mL (has no administration in time range)  potassium chloride SA (KLOR-CON M) CR tablet 40 mEq (has no administration in time range)  0.9 %  sodium chloride infusion (has no administration in time range)  enoxaparin (LOVENOX) injection 40 mg (has no administration in time range)  acetaminophen (TYLENOL) tablet 650 mg (has no administration in time range)    Or  acetaminophen (TYLENOL) suppository 650 mg (has no administration in time range)  oxyCODONE (Oxy IR/ROXICODONE) immediate release tablet 5 mg (has no administration in time range)  traZODone (DESYREL) tablet 25 mg (has no administration in time range)  ondansetron (ZOFRAN) tablet 4 mg (has no administration in time range)    Or  ondansetron (ZOFRAN) injection 4 mg (has no administration in time range)  albuterol (PROVENTIL) (2.5 MG/3ML) 0.083% nebulizer solution 2.5 mg (has no administration in time range)  hydrALAZINE (APRESOLINE) injection 5 mg (has no administration in time range)  ciprofloxacin (CIPRO) IVPB 400 mg (has no administration in  time range)  sodium chloride 0.9 % bolus 1,000 mL (0 mLs Intravenous Stopped 04/22/23 1412)    ED Course/ Medical Decision Making/ A&P                                 Medical Decision Making Amount and/or Complexity of Data Reviewed Labs: ordered.   This patient presents to the ED  with chief complaint(s) of  .  The complaint involves an extensive differential diagnosis and also carries with it a high risk of complications and morbidity.   pertinent past medical history as listed in HPI  The differential diagnosis includes  Gastroenteritis, ACS, mass effect, vertigo The initial plan is to  Will obtain labs, give IV fluids. Additional history obtained: Additional history obtained from family Records reviewed previous admission documents  Initial Assessment:   Patient is ill-appearing, currently tachycardic 115, blood pressure 108/77, denies any pain or discomfort.  Does endorse dizziness with persistent diarrhea.  No abdominal tenderness.   Independent ECG interpretation:  Sinus tachycardia  Independent labs interpretation:  The following labs were independently interpreted:  CBC unremarkable, UA with moderate hemoglobin, CMP with mild hypokalemia of 3.2 and moderate hyponatremia of 128, AKI with creatinine of 1.75 from 0.9 baseline, elevated AST and ALT of 180 and 52 respectively, hyperbilirubinemia of 1.4, occult positive  Independent visualization and interpretation of imaging: None   Treatment and Reassessment: Patient given liter bolus of normal saline following first assessment  2:30 PM patient remains tachycardic 113, blood pressure still stable.  Continues to deny any pain or discomfort, has persistent diarrhea.  Will give another bolus of fluids.  Discussed possible admission with patient, he is agreeable.  Consultations obtained:   Hospitalist Dr. Erenest Blank: Return call at 2:45 pm, is willing to accept patient for admission.  Stool culture pending.  Disposition:    Patient will be admitted for AKI in the setting of persistent enteritis  Social Determinants of Health:   none  This note was dictated with voice recognition software.  Despite best efforts at proofreading, errors may have occurred which can change the documentation meaning.          Final Clinical Impression(s) / ED Diagnoses Final diagnoses:  Diarrhea, unspecified type  Dizziness    Rx / DC Orders ED Discharge Orders     None         Fabienne Bruns 04/22/23 2011    Maia Plan, MD 04/24/23 1243

## 2023-04-22 NOTE — ED Triage Notes (Signed)
Pt arrived via POV. C/o dizziness for 3x days, and diarrhea for 2x days. Was hypotensive in 70's this AM with home BP.  AOx4   Recently exposed to salmonella

## 2023-04-22 NOTE — ED Notes (Signed)
Bedside commode in place. 

## 2023-04-22 NOTE — ED Notes (Signed)
Pt unable to provide urine at this time, handheld urinal at bedside for pt

## 2023-04-22 NOTE — H&P (Signed)
History and Physical  James Dudley ZOX:[id number] DOB: November 12, 1970 DOA: 04/22/2023  PCP: [facility]   Chief Complaint: Diarrhea  HPI: James Dudley is a 52 y.o. male with medical history significant for hypertension, hyperlipidemia admitted to the hospital with diarrhea, hypotension and acute kidney injury.  Patient states his mother was diagnosed with Salmonella about a week ago, he started having a lot of copious watery diarrhea up to 48 hours ago.  Denies any fevers, significant abdominal pain, nausea/vomiting, or blood in his stool.  He takes blood pressure medication at home, has continued taking this.  Today he decided to come to the ER for evaluation because he was lightheaded to the point that he had to crawl on the floor.  Review of Systems: Please see HPI for pertinent positives and negatives. A complete 10 system review of systems are otherwise negative.  Past Medical History:  Diagnosis Date   Back injury    Collapsed lung    Hyperlipemia    Sciatic pain    Past Surgical History:  Procedure Laterality Date   BACK SURGERY     LUNG SURGERY     LUNG SURGERY     atalectasis    Social History:  reports that he has quit smoking. His smoking use included cigarettes. He has a 25 pack-year smoking history. He has never used smokeless tobacco. He reports that he does not drink alcohol and does not use drugs.   Allergies  Allergen Reactions   Other Hives    Darvocet-n   Codeine Rash   Penicillins Rash   Tramadol     Family History  Problem Relation Age of Onset   Diabetes Mother    Hypertension Other      Prior to Admission medications   Medication Sig Start Date End Date Taking? Authorizing Provider  AMITRIPTYLINE HCL PO Take 100 mg by mouth daily.    [provider]  atorvastatin (LIPITOR) 80 MG tablet TAKE 1 TABLET BY MOUTH DAILY 08/22/22   Little Ishikawa, MD  gabapentin (NEURONTIN) 600 MG tablet Take 600 mg by mouth  daily. Patient not taking: Reported on 01/22/2023    [provider]  hydrOXYzine (ATARAX) 25 MG tablet Take 25-50 mg by mouth every 8 (eight) hours as needed. 12/20/22   [provider]  losartan (COZAAR) 25 MG tablet TAKE 1 TABLET BY MOUTH DAILY 03/20/23   Little Ishikawa, MD  metoprolol succinate (TOPROL-XL) 25 MG 24 hr tablet TAKE 1 TABLET BY MOUTH DAILY 12/19/22   Little Ishikawa, MD    Physical Exam: BP 113/80   Pulse (!) 104   Temp 98.2 F (36.8 C) (Oral)   Resp 16   Ht 5\' 10"  (1.778 m)   Wt 74.8 kg   SpO2 99%   BMI 23.68 kg/m   General:  Alert, oriented, calm, in no acute distress  Eyes: EOMI, clear conjuctivae, white sclerea Neck: supple, no masses, trachea mildline  Cardiovascular: RRR, no murmurs or rubs, no peripheral edema  Respiratory: clear to auscultation bilaterally, no wheezes, no crackles  Abdomen: soft, nontender, nondistended, normal bowel tones heard  Skin: dry, no rashes  Musculoskeletal: no joint effusions, normal range of motion  Psychiatric: appropriate affect, normal speech  Neurologic: extraocular muscles intact, clear speech, moving all extremities with intact sensorium         Labs on Admission:  Basic Metabolic Panel: Recent Labs  Lab 04/22/23 1046  NA 128*  K 3.2*  CL  95*  CO2 22  GLUCOSE 110*  BUN 39*  CREATININE 1.75*  CALCIUM 8.6*   Liver Function Tests: Recent Labs  Lab 04/22/23 1046  AST 180*  ALT 52*  ALKPHOS 75  BILITOT 1.4*  PROT 7.5  ALBUMIN 4.1   Recent Labs  Lab 04/22/23 1046  LIPASE 23   No results for input(s): "AMMONIA" in the last 168 hours. CBC: Recent Labs  Lab 04/22/23 1046  WBC 5.3  HGB 14.1  HCT 43.0  MCV 88.3  PLT 163   Cardiac Enzymes: No results for input(s): "CKTOTAL", "CKMB", "CKMBINDEX", "TROPONINI" in the last 168 hours.  BNP (last 3 results) No results for input(s): "BNP" in the last 8760 hours.  ProBNP (last 3 results) No results for input(s): "PROBNP"  in the last 8760 hours.  CBG: No results for input(s): "GLUCAP" in the last 168 hours.  Radiological Exams on Admission: No results found.  Assessment/Plan James Dudley is a 52 y.o. male with medical history significant for hypertension, hyperlipidemia admitted to the hospital with diarrhea, hypotension and acute kidney injury.  Acute kidney injury-due to hypotension from dehydration. -Observation admission -IV fluids -Avoid nephrotoxins  Diarrhea-Salmonella suspected, check stool studies, given age over 92 and severity of diarrhea will start empiric IV fluoroquinolone  Hypokalemia-due to GI losses, repleted orally in the ER  Hypertension-hold home metoprolol and Cozaar  Hyperlipidemia-continue Lipitor  DVT prophylaxis: Lovenox     Code Status: Full Code  Consults called: None  Admission status: Observation   Time spent: 59 minutes  Klea Nall Sharlette Dense MD [facility] Pager [phone]  If 7PM-7AM, please contact night-coverage [email or web address] Password [facility]  04/22/2023, 2:48 PM

## 2023-04-23 DIAGNOSIS — A020 Salmonella enteritis: Secondary | ICD-10-CM | POA: Diagnosis not present

## 2023-04-23 DIAGNOSIS — D61818 Other pancytopenia: Secondary | ICD-10-CM | POA: Diagnosis present

## 2023-04-23 DIAGNOSIS — N179 Acute kidney failure, unspecified: Secondary | ICD-10-CM | POA: Diagnosis present

## 2023-04-23 DIAGNOSIS — Z87891 Personal history of nicotine dependence: Secondary | ICD-10-CM | POA: Diagnosis not present

## 2023-04-23 DIAGNOSIS — Z8249 Family history of ischemic heart disease and other diseases of the circulatory system: Secondary | ICD-10-CM | POA: Diagnosis not present

## 2023-04-23 DIAGNOSIS — E871 Hypo-osmolality and hyponatremia: Secondary | ICD-10-CM | POA: Diagnosis present

## 2023-04-23 DIAGNOSIS — I959 Hypotension, unspecified: Secondary | ICD-10-CM | POA: Diagnosis present

## 2023-04-23 DIAGNOSIS — A09 Infectious gastroenteritis and colitis, unspecified: Secondary | ICD-10-CM | POA: Diagnosis not present

## 2023-04-23 DIAGNOSIS — E8809 Other disorders of plasma-protein metabolism, not elsewhere classified: Secondary | ICD-10-CM | POA: Diagnosis present

## 2023-04-23 DIAGNOSIS — E860 Dehydration: Secondary | ICD-10-CM | POA: Diagnosis present

## 2023-04-23 DIAGNOSIS — R197 Diarrhea, unspecified: Secondary | ICD-10-CM | POA: Diagnosis present

## 2023-04-23 DIAGNOSIS — Z885 Allergy status to narcotic agent status: Secondary | ICD-10-CM | POA: Diagnosis not present

## 2023-04-23 DIAGNOSIS — R195 Other fecal abnormalities: Secondary | ICD-10-CM | POA: Diagnosis present

## 2023-04-23 DIAGNOSIS — I10 Essential (primary) hypertension: Secondary | ICD-10-CM | POA: Diagnosis present

## 2023-04-23 DIAGNOSIS — R945 Abnormal results of liver function studies: Secondary | ICD-10-CM | POA: Diagnosis present

## 2023-04-23 DIAGNOSIS — Z79899 Other long term (current) drug therapy: Secondary | ICD-10-CM | POA: Diagnosis not present

## 2023-04-23 DIAGNOSIS — Z880 Allergy status to penicillin: Secondary | ICD-10-CM | POA: Diagnosis not present

## 2023-04-23 DIAGNOSIS — R000 Tachycardia, unspecified: Secondary | ICD-10-CM | POA: Diagnosis present

## 2023-04-23 DIAGNOSIS — I2510 Atherosclerotic heart disease of native coronary artery without angina pectoris: Secondary | ICD-10-CM | POA: Diagnosis present

## 2023-04-23 DIAGNOSIS — R339 Retention of urine, unspecified: Secondary | ICD-10-CM | POA: Diagnosis present

## 2023-04-23 DIAGNOSIS — E785 Hyperlipidemia, unspecified: Secondary | ICD-10-CM | POA: Diagnosis present

## 2023-04-23 DIAGNOSIS — R7401 Elevation of levels of liver transaminase levels: Secondary | ICD-10-CM | POA: Diagnosis present

## 2023-04-23 DIAGNOSIS — E876 Hypokalemia: Secondary | ICD-10-CM | POA: Diagnosis present

## 2023-04-23 DIAGNOSIS — A029 Salmonella infection, unspecified: Secondary | ICD-10-CM | POA: Diagnosis present

## 2023-04-23 LAB — HEPATITIS PANEL, ACUTE
HCV Ab: NONREACTIVE
Hep A IgM: NONREACTIVE
Hep B C IgM: NONREACTIVE
Hepatitis B Surface Ag: NONREACTIVE

## 2023-04-23 LAB — BASIC METABOLIC PANEL
Anion gap: 10 (ref 5–15)
BUN: 21 mg/dL — ABNORMAL HIGH (ref 6–20)
CO2: 20 mmol/L — ABNORMAL LOW (ref 22–32)
Calcium: 7.9 mg/dL — ABNORMAL LOW (ref 8.9–10.3)
Chloride: 102 mmol/L (ref 98–111)
Creatinine, Ser: 0.87 mg/dL (ref 0.61–1.24)
GFR, Estimated: >60 mL/min (ref >60)
Glucose, Bld: 89 mg/dL (ref 70–99)
Potassium: 3.4 mmol/L — ABNORMAL LOW (ref 3.5–5.1)
Sodium: 132 mmol/L — ABNORMAL LOW (ref 135–145)

## 2023-04-23 LAB — CBC
HCT: 35.2 % — ABNORMAL LOW (ref 39.0–52.0)
Hemoglobin: 11.3 g/dL — ABNORMAL LOW (ref 13.0–17.0)
MCH: 28.9 pg (ref 26.0–34.0)
MCHC: 32.1 g/dL (ref 30.0–36.0)
MCV: 90 fL (ref 80.0–100.0)
Platelets: 136 10*3/uL — ABNORMAL LOW (ref 150–400)
RBC: 3.91 MIL/uL — ABNORMAL LOW (ref 4.22–5.81)
RDW: 13.1 % (ref 11.5–15.5)
WBC: 2.8 10*3/uL — ABNORMAL LOW (ref 4.0–10.5)
nRBC: 0 % (ref 0.0–0.2)

## 2023-04-23 MED ORDER — SODIUM CHLORIDE 0.9 % IV SOLN
INTRAVENOUS | Status: DC
Start: 2023-04-23 — End: 2023-04-23

## 2023-04-23 MED ORDER — KCL IN DEXTROSE-NACL 20-5-0.45 MEQ/L-%-% IV SOLN
INTRAVENOUS | Status: AC
  Filled 2023-04-23 (×2): qty 1000

## 2023-04-23 MED ORDER — AMITRIPTYLINE HCL 25 MG PO TABS
100.0000 mg | ORAL_TABLET | Freq: Every day | ORAL | Status: DC
  Administered 2023-04-23: 100 mg via ORAL
  Filled 2023-04-23 (×2): qty 4

## 2023-04-23 NOTE — Progress Notes (Signed)
[facility] PROGRESS NOTE  James Dudley (DOB: [birth year]) [id number] PCP: [facility]  Brief Narrative: James Dudley is a 52 y.o. male with a history of HTN, HLD, headaches, and coronary calcifications who presented to the ED on 04/22/2023 with diarrhea found to have profuse, nonbloody, but +FOBT, diarrhea, AKI, and hypotension responsive to IV fluids. His mother was admitted for salmonella infection previously, so he was started on ciprofloxacin and admitted with GI pathogen panel pending.   Subjective: Tried eating some crackers which tasted horrendous to him, unable to swallow anything solid so far today. Has had 2 stools that were reportedly pure water/mucous, no blood. No significant abd pain. No fever.   Objective: BP 108/84 (BP Location: Right Arm)   Pulse 83   Temp 98.1 F (36.7 C) (Oral)   Resp 18   Ht 5\' 10"  (1.778 m)   Wt 74.8 kg   SpO2 100%   BMI 23.68 kg/m   Gen: No distress Pulm: Clear, nonlabored  CV: RRR, no MRG or edema GI: Soft, NT, ND, hyperactive BS. Neuro: Alert and oriented. No new focal deficits. Ext: Warm, no deformities. Skin: Does not appear jaundiced. No acute-appearing rashes, lesions or ulcers on visualized skin   Assessment & Plan: Diarrhea: Suspected to be due to Salmonella infection with +close contact. Continues to have liquids stools, and poor oral intake, food aversion.  - GI pathogen panel is pending, though I agree that empiric FQ Tx is warranted due to close contact who was positive and his risk factors, namely coronary atherosclerotic (83rd %ile CAC score Nov 2022) disease and severity of presentation (requiring hospitalization).   AKI: Prerenal, has resolved back to his putative baseline Cr with fair urine output.  - Still poor oral intake and ongoing GI losses, will continue IV fluids, change to balanced crystalloid with dextrose and K supplementation. Monitor in AM.   Pancytopenia: Unveiled with large  volume resuscitation. No bleeding grossly.  - Recheck in AM w/diff.  - Ok to continue VTE ppx in absence of gross bleeding as hgb is still >11 and plt >100k.  LFT elevations: Likely reactive to primary process as above.  - R/o acute hepatitis w/panel. If not improving, suggest further work up.   HTN:  - Hold losartan with AKI and metoprolol as well with hypotension on admission. Currently normotensive.   HLD:  - With LFT elevations, will hold statin for right now.   Hypokalemia:  - supplement by IV and monitor.   Tyrone Nine, MD [facility] [email or web address] 04/23/2023, 3:18 PM

## 2023-04-23 NOTE — Plan of Care (Signed)
  Problem: Education: Goal: Knowledge of General Education information will improve Description: Including pain rating scale, medication(s)/side effects and non-pharmacologic comfort measures Outcome: Progressing   Problem: Clinical Measurements: Goal: Ability to maintain clinical measurements within normal limits will improve Outcome: Progressing   

## 2023-04-24 DIAGNOSIS — R197 Diarrhea, unspecified: Secondary | ICD-10-CM | POA: Diagnosis not present

## 2023-04-24 LAB — COMPREHENSIVE METABOLIC PANEL
ALT: 47 U/L — ABNORMAL HIGH (ref 0–44)
AST: 114 U/L — ABNORMAL HIGH (ref 15–41)
Albumin: 3 g/dL — ABNORMAL LOW (ref 3.5–5.0)
Alkaline Phosphatase: 59 U/L (ref 38–126)
Anion gap: 9 (ref 5–15)
BUN: 15 mg/dL (ref 6–20)
CO2: 25 mmol/L (ref 22–32)
Calcium: 8.5 mg/dL — ABNORMAL LOW (ref 8.9–10.3)
Chloride: 103 mmol/L (ref 98–111)
Creatinine, Ser: 0.95 mg/dL (ref 0.61–1.24)
GFR, Estimated: >60 mL/min (ref >60)
Glucose, Bld: 105 mg/dL — ABNORMAL HIGH (ref 70–99)
Potassium: 3.9 mmol/L (ref 3.5–5.1)
Sodium: 137 mmol/L (ref 135–145)
Total Bilirubin: 1 mg/dL (ref <1.2)
Total Protein: 5.7 g/dL — ABNORMAL LOW (ref 6.5–8.1)

## 2023-04-24 LAB — CBC WITH DIFFERENTIAL/PLATELET
Abs Immature Granulocytes: 0.01 10*3/uL (ref 0.00–0.07)
Basophils Absolute: 0 10*3/uL (ref 0.0–0.1)
Basophils Relative: 1 %
Eosinophils Absolute: 0.1 10*3/uL (ref 0.0–0.5)
Eosinophils Relative: 2 %
HCT: 35.1 % — ABNORMAL LOW (ref 39.0–52.0)
Hemoglobin: 11.5 g/dL — ABNORMAL LOW (ref 13.0–17.0)
Immature Granulocytes: 0 %
Lymphocytes Relative: 19 %
Lymphs Abs: 0.6 10*3/uL — ABNORMAL LOW (ref 0.7–4.0)
MCH: 29 pg (ref 26.0–34.0)
MCHC: 32.8 g/dL (ref 30.0–36.0)
MCV: 88.4 fL (ref 80.0–100.0)
Monocytes Absolute: 0.6 10*3/uL (ref 0.1–1.0)
Monocytes Relative: 18 %
Neutro Abs: 1.9 10*3/uL (ref 1.7–7.7)
Neutrophils Relative %: 60 %
Platelets: 163 10*3/uL (ref 150–400)
RBC: 3.97 MIL/uL — ABNORMAL LOW (ref 4.22–5.81)
RDW: 12.9 % (ref 11.5–15.5)
WBC: 3.2 10*3/uL — ABNORMAL LOW (ref 4.0–10.5)
nRBC: 0 % (ref 0.0–0.2)

## 2023-04-24 LAB — HIV ANTIBODY (ROUTINE TESTING W REFLEX): HIV Screen 4th Generation wRfx: NONREACTIVE

## 2023-04-24 MED ORDER — AMITRIPTYLINE HCL 25 MG PO TABS
100.0000 mg | ORAL_TABLET | Freq: Every day | ORAL | Status: DC
Start: 2023-04-24 — End: 2023-04-27
  Administered 2023-04-24 – 2023-04-26 (×3): 100 mg via ORAL
  Filled 2023-04-24 (×3): qty 4

## 2023-04-24 MED ORDER — KCL IN DEXTROSE-NACL 20-5-0.45 MEQ/L-%-% IV SOLN
INTRAVENOUS | Status: AC
Start: 2023-04-24 — End: 2023-04-25
  Filled 2023-04-24 (×2): qty 1000

## 2023-04-24 NOTE — Progress Notes (Signed)
   04/24/23 0919  TOC Brief Assessment  Insurance and Status Reviewed  Patient has primary care physician Yes  Home environment has been reviewed Single family home  Prior level of function: Independent  Prior/Current Home Services No current home services  Social Determinants of Health Reivew SDOH reviewed no interventions necessary  Readmission risk has been reviewed Yes  Transition of care needs no transition of care needs at this time

## 2023-04-24 NOTE — Progress Notes (Signed)
[facility] PROGRESS NOTE  James Dudley (DOB: 08-20-1970) MVH:[id number] PCP: [facility]  Brief Narrative: James Dudley is a 52 y.o. male with a history of HTN, HLD, headaches, and coronary calcifications who presented to the ED on 04/22/2023 with diarrhea found to have profuse, nonbloody, but +FOBT, diarrhea, AKI, and hypotension responsive to IV fluids. His mother was admitted for salmonella infection previously, so he was started on ciprofloxacin and admitted with GI pathogen panel pending.   Subjective: Only po has been some pedialyte and hard candy, a couple pieces. He gags when he smells food. Some nausea, no vomiting, still nonbloody (grossly) stools. No fevers.   Objective: BP 127/88 (BP Location: Right Arm)   Pulse 88   Temp 98.3 F (36.8 C) (Oral)   Resp 18   Ht 5\' 10"  (1.778 m)   Wt 74.8 kg   SpO2 100%   BMI 23.68 kg/m   No distress Clear, nonlabored RRR, no edema Soft, NT, ND, +BS, no masses  Assessment & Plan: Diarrhea: Suspected to be due to Salmonella infection with +close contact. Continues to have liquids stools, and poor oral intake, food aversion.  - GI pathogen panel is still pending, though I agree that empiric FQ Tx is warranted due to close contact who was positive and his risk factors, namely coronary atherosclerotic (83rd %ile CAC score Nov 2022) disease and severity of presentation (requiring hospitalization). Output decreasing, but still very poor po intake/po aversion, will continue IVF.  - If positive FOBT persists after this acute episode, would get colonoscopy  AKI: Prerenal, has resolved back to his putative baseline Cr with fair urine output.  - Still poor oral intake and ongoing GI losses, will continue IV fluids, change to balanced crystalloid with dextrose and K supplementation. Monitor again in AM.   Acute urinary retention:  - Continue serial bladder scans. UA unremarkable micro with +dipstick  protein.  Pancytopenia: Unveiled with large volume resuscitation. No bleeding grossly. Plt normalized, hgb stabilized. WBC rebounding. - Ok to continue VTE ppx in absence of gross bleeding as hgb is still >11 and plt >100k.  LFT elevations: Likely reactive to primary process as above.  - R/o acute hepatitis w/panel. If not improving, suggest further work up.   HTN:  - Holding losartan with AKI and metoprolol as well with hypotension on admission. Currently normotensive.   HLD:  - With LFT elevations, will hold statin for right now.   Hypokalemia:  - Resolved.  Tyrone Nine, MD [facility] [email or web address] 04/24/2023, 5:35 PM

## 2023-04-24 NOTE — Plan of Care (Signed)

## 2023-04-25 DIAGNOSIS — E860 Dehydration: Secondary | ICD-10-CM | POA: Diagnosis not present

## 2023-04-25 DIAGNOSIS — N179 Acute kidney failure, unspecified: Secondary | ICD-10-CM | POA: Diagnosis not present

## 2023-04-25 DIAGNOSIS — R197 Diarrhea, unspecified: Secondary | ICD-10-CM | POA: Diagnosis not present

## 2023-04-25 MED ORDER — KCL IN DEXTROSE-NACL 20-5-0.45 MEQ/L-%-% IV SOLN
INTRAVENOUS | Status: AC
Start: 2023-04-25 — End: 2023-04-26
  Filled 2023-04-25 (×2): qty 1000

## 2023-04-25 NOTE — Plan of Care (Signed)

## 2023-04-25 NOTE — Progress Notes (Signed)
PROGRESS NOTE    James Dudley  WJX:[id number] DOB: 1970-11-17 DOA: 04/22/2023 PCP: [facility]   Brief Narrative:  James Dudley is a 52 y.o. male with a history of HTN, HLD, headaches, and coronary calcifications who presented to the ED on 04/22/2023 with diarrhea found to have profuse, nonbloody, but +FOBT, diarrhea, AKI, and hypotension responsive to IV fluids. His mother was admitted for salmonella infection previously, so he was started on ciprofloxacin and admitted with GI pathogen panel still pending so has been reordered and finally sent.   Assessment and Plan:  Diarrhea -Suspected to be due to Salmonella infection with +close contact. -Continues to have liquids stools, and poor oral intake, food aversion.  -GI pathogen panel is still pending and now re-ordered, though I agree that empiric FQ Tx is warranted due to close contact who was positive and his risk factors, namely coronary atherosclerotic (83rd %ile CAC score Nov 2022) disease and severity of presentation (requiring hospitalization). Output decreasing, but still very poor po intake/po aversion, will renew and continue IVF.  -Get Nutritionist on board for further evaluation  -If positive FOBT persists after this acute episode, would recommend he get colonoscopy   AKI -Prerenal, has resolved back to his putative baseline Cr with fair urine output.  -BUN/Cr Trend: Recent Labs  Lab 04/22/23 1046 04/23/23 0452 04/24/23 0436  BUN 39* 21* 15  CREATININE 1.75* 0.87 0.95  -Still poor oral intake and ongoing GI losses, will continue IV fluids, change to balanced crystalloid with dextrose and K supplementation. Monitor again in AM.  Avoid Nephrotoxic Medications, Contrast Dyes, Hypotension and Dehydration to Ensure Adequate Renal Perfusion and will need to Renally Adjust Meds -Continue to Monitor and Trend Renal Function carefully and repeat CMP in the AM    Acute Urinary Retention -Continue serial  bladder scans. UA unremarkable micro with +dipstick protein (30 Protein)   Pancytopenia -Unveiled with large volume resuscitation.  -No bleeding grossly. Plt normalized, hgb stabilized. WBC rebounding. -CBC Trend: Recent Labs  Lab 04/22/23 1046 04/23/23 0452 04/24/23 0436  WBC 5.3 2.8* 3.2*  HGB 14.1 11.3* 11.5*  HCT 43.0 35.2* 35.1*  MCV 88.3 90.0 88.4  PLT 163 136* 163  -Ok to continue VTE ppx in absence of gross bleeding as hgb is still >11 and plt >100k.   Abnormal LFTs Hyperbilirubinemia -Likely reactive to primary process as above.  -Acute Hepatitis Negative  -AST/ALT Trend: Recent Labs  Lab 04/22/23 1046 04/24/23 0436  AST 180* 114*  ALT 52* 47*  BILITOT 1.4* 1.0  -Check RUQ U/S -Continue to Monitor and Trend Hepatic Fxn Panel -Repeat CMP in the AM    HTN -Holding losartan with AKI and metoprolol as well with hypotension on admission.  -Currently normotensive.  -Continue to Monitor BP per Protocol -Last BP reading was    HLD -With Abnormal LFT and elevations, will  continue tohold statin for right now.    Hypokalemia -Patient's K+ Level Trend: Recent Labs  Lab 04/22/23 1046 04/23/23 0452 04/24/23 0436  K 3.2* 3.4* 3.9  -Continue to Monitor and Replete as Necessary -Repeat CMP in the AM   Hypoalbuminemia -Patient's Albumin Trend: Recent Labs  Lab 04/22/23 1046 04/24/23 0436  ALBUMIN 4.1 3.0*  -Continue to Monitor and Trend and repeat CMP in the AM   DVT prophylaxis: enoxaparin (LOVENOX) injection 40 mg Start: 04/22/23 2200 SCDs Start: 04/22/23 1447    Code Status: Full Code Family Communication: No family present at bedside  Disposition Plan:  Level of care: Med-Surg Status is: Inpatient Remains inpatient appropriate because: Needs further clinical improvement and improvement in his diarrhea and tolerance of diet   Consultants:  None  Procedures:  As delineated as above  Antimicrobials:  Anti-infectives (From admission, onward)     Start     Dose/Rate Route Frequency Ordered Stop   04/22/23 1500  ciprofloxacin (CIPRO) IVPB 400 mg        400 mg 200 mL/hr over 60 Minutes Intravenous Every 12 hours 04/22/23 1448         Subjective: Seen and examined at bedside and continues to have watery stools and still has severe food aversion and states the only thing he can eat is "[employer] ranc[employer]'s".  Just feels blah and does not really feel like eating very much at all.  Objective: Vitals:   04/24/23 1359 04/24/23 2036 04/25/23 0516 04/25/23 1149  BP: 127/88 110/77 132/89 (!) 128/94  Pulse: 88 81 84 (!) 103  Resp: 18 16 18 18   Temp: 98.3 F (36.8 C) 97.6 F (36.4 C) 97.8 F (36.6 C) 98.5 F (36.9 C)  TempSrc: Oral Oral Oral   SpO2: 100% 99% 100% 98%  Weight:      Height:        Intake/Output Summary (Last 24 hours) at 04/25/2023 1904 Last data filed at 04/25/2023 1700 Gross per 24 hour  Intake 2076.76 ml  Output --  Net 2076.76 ml   Filed Weights   04/22/23 1022  Weight: 74.8 kg   Examination: Physical Exam:  Constitutional: WN/WD Caucasian male in no acute distress appears calm Respiratory: Diminished to auscultation bilaterally, no wheezing, rales, rhonchi or crackles. Normal respiratory effort and patient is not tachypenic. No accessory muscle use.  Unlabored breathing Cardiovascular: RRR, no murmurs / rubs / gallops. S1 and S2 auscultated. No extremity edema. Abdomen: Soft, non-tender, non-distended. Bowel sounds positive.  GU: Deferred. Musculoskeletal: No clubbing / cyanosis of digits/nails. No joint deformity upper and lower extremities.  Skin: No rashes, lesions, ulcers on a limited skin evaluation. No induration; Warm and dry.  Neurologic: CN 2-12 grossly intact with no focal deficits.  Romberg sign and cerebellar reflexes not assessed.  Psychiatric: Normal judgment and insight. Alert and oriented x 3. Normal mood and appropriate affect.   Data Reviewed: I have personally reviewed following  labs and imaging studies  CBC: Recent Labs  Lab 04/22/23 1046 04/23/23 0452 04/24/23 0436  WBC 5.3 2.8* 3.2*  NEUTROABS  --   --  1.9  HGB 14.1 11.3* 11.5*  HCT 43.0 35.2* 35.1*  MCV 88.3 90.0 88.4  PLT 163 136* 163   Basic Metabolic Panel: Recent Labs  Lab 04/22/23 1046 04/23/23 0452 04/24/23 0436  NA 128* 132* 137  K 3.2* 3.4* 3.9  CL 95* 102 103  CO2 22 20* 25  GLUCOSE 110* 89 105*  BUN 39* 21* 15  CREATININE 1.75* 0.87 0.95  CALCIUM 8.6* 7.9* 8.5*   GFR: Estimated Creatinine Clearance: 91.8 mL/min (by C-G formula based on SCr of 0.95 mg/dL). Liver Function Tests: Recent Labs  Lab 04/22/23 1046 04/24/23 0436  AST 180* 114*  ALT 52* 47*  ALKPHOS 75 59  BILITOT 1.4* 1.0  PROT 7.5 5.7*  ALBUMIN 4.1 3.0*   Recent Labs  Lab 04/22/23 1046  LIPASE 23   No results for input(s): "AMMONIA" in the last 168 hours. Coagulation Profile: No results for input(s): "INR", "PROTIME" in the last 168 hours. Cardiac Enzymes: No results for input(s): "CKTOTAL", "CKMB", "  CKMBINDEX", "TROPONINI" in the last 168 hours. BNP (last 3 results) No results for input(s): "PROBNP" in the last 8760 hours. HbA1C: No results for input(s): "HGBA1C" in the last 72 hours. CBG: No results for input(s): "GLUCAP" in the last 168 hours. Lipid Profile: No results for input(s): "CHOL", "HDL", "LDLCALC", "TRIG", "CHOLHDL", "LDLDIRECT" in the last 72 hours. Thyroid Function Tests: No results for input(s): "TSH", "T4TOTAL", "FREET4", "T3FREE", "THYROIDAB" in the last 72 hours. Anemia Panel: No results for input(s): "VITAMINB12", "FOLATE", "FERRITIN", "TIBC", "IRON", "RETICCTPCT" in the last 72 hours. Sepsis Labs: No results for input(s): "PROCALCITON", "LATICACIDVEN" in the last 168 hours.  No results found for this or any previous visit (from the past 240 hour(s)).   Radiology Studies: No results found.  Scheduled Meds:  amitriptyline  100 mg Oral q1600   enoxaparin (LOVENOX) injection   40 mg Subcutaneous QHS   Continuous Infusions:  ciprofloxacin Stopped (04/25/23 1015)   dextrose 5 % and 0.45 % NaCl with KCl 20 mEq/L      LOS: 2 days   Marguerita Merles, DO [facility] Available via Epic secure chat 7am-7pm After these hours, please refer to coverage provider listed on [email or web address] 04/25/2023, 7:04 PM

## 2023-04-25 NOTE — Hospital Course (Addendum)
James Dudley is a 52 y.o. male with a history of HTN, HLD, headaches, and coronary calcifications who presented to the ED on 04/22/2023 with diarrhea found to have profuse, nonbloody, but +FOBT, diarrhea, AKI, and hypotension responsive to IV fluids. His mother was admitted for salmonella infection previously, so he was started on ciprofloxacin and admitted with GI pathogen panel is now positive for Salmonella. He is improving significantly and anticipating D/C in the next 24 hours.    Assessment and Plan:  Diarrhea in the setting of Salmonella Species -Initially suspected to be due to Salmonella infection with +close contact and now Confirmed -Continued to have liquids stools, and poor oral intake, food aversion but is much improved from yesterday  -GI pathogen panel is POSITIVE for Salmonella Species -C/w Empiric FQ Tx is warranted due to close contact who was positive and his risk factors, namely coronary atherosclerotic (83rd %ile CAC score Nov 2022) disease and severity of presentation (requiring hospitalization). -Output decreasing and appetite is improving so will not renew IV fluid hydration today as he was on D5 1/2 NS at 75 mL/hr -Continuing ciprofloxacin for treatment but will change to p.o. 500 mg po BID now -Get Nutritionist on board for further evaluation  -If positive FOBT persists after this acute episode, would recommend he get colonoscopy but this can be done in outpatient setting given that he is improving and he already has a colonoscopy set up for the end of January -Given that he is improved will discharge home with close PCP follow-up   AKI, improved  -Prerenal, has resolved back to his putative baseline Cr with fair urine output.  -BUN/Cr Trend: Recent Labs  Lab 04/22/23 1046 04/23/23 0452 04/24/23 0436 04/26/23 0433 04/27/23 0411  BUN 39* 21* 15 5* 6  CREATININE 1.75* 0.87 0.95 0.89 0.92  -Still poor oral intake and ongoing GI losses, will continue IV fluids,  change to balanced crystalloid with dextrose and K supplementation. Monitor again in AM.  Avoid Nephrotoxic Medications, Contrast Dyes, Hypotension and Dehydration to Ensure Adequate Renal Perfusion and will need to Renally Adjust Meds -Continue to Monitor and Trend Renal Function carefully and repeat CMP within 1 week   Acute Urinary Retention, improved -Continue serial bladder scans. UA unremarkable micro with +dipstick protein (30 Protein)   Pancytopenia, improving  -Unveiled with large volume resuscitation.  -No bleeding grossly. Plt normalized, hgb stabilized. WBC rebounding and now normalized. -CBC Trend: Recent Labs  Lab 04/22/23 1046 04/23/23 0452 04/24/23 0436 04/26/23 0433 04/27/23 0411  WBC 5.3 2.8* 3.2* 4.6 6.4  HGB 14.1 11.3* 11.5* 11.6* 11.9*  HCT 43.0 35.2* 35.1* 35.1* 36.9*  MCV 88.3 90.0 88.4 88.2 89.1  PLT 163 136* 163 217 247  -Ok to continue VTE ppx in absence of gross bleeding as hgb is still >11 and plt >100k.   Abnormal LFTs, improving  Hyperbilirubinemia, improved -Likely reactive to primary process as above.  -Acute Hepatitis Negative  -AST/ALT Trend: Recent Labs  Lab 04/22/23 1046 04/24/23 0436 04/26/23 0433 04/27/23 0411  AST 180* 114* 87* 102*  ALT 52* 47* 53* 82*  BILITOT 1.4* 1.0 0.4 0.4  -Check RUQ U/S if necessary but will hold off -Continue to Monitor and Trend Hepatic Fxn Panel -Repeat CMP within 1 week   HTN -Holding losartan with AKI and metoprolol as well with hypotension on admission.  -Currently normotensive.  -Continue to Monitor BP per Protocol -Last BP reading was 134/92   HLD -With Abnormal LFT and elevations, will  continue to hold statin for right now and likely can resume at D/C    Hypokalemia -Patient's K+ Level Trend: Recent Labs  Lab 04/22/23 1046 04/23/23 0452 04/24/23 0436 04/26/23 0433 04/27/23 0411  K 3.2* 3.4* 3.9 3.5 3.9  -Continue to Monitor and Replete as Necessary -Repeat CMP within 1  week  Hypomagnesemia -Patient's Mag Level Trend: Recent Labs  Lab 04/26/23 0433 04/27/23 0411  MG 1.3* 1.9  -Replete with IV Mag Sulfate 4 grams yesterday -Continue to Monitor and Replete as Necessary -Repeat Mag within 1 week  Hypoalbuminemia -Patient's Albumin Trend: Recent Labs  Lab 04/22/23 1046 04/24/23 0436 04/26/23 0433 04/27/23 0411  ALBUMIN 4.1 3.0* 3.3* 3.2*  -Continue to Monitor and Trend and repeat CMP in the AM

## 2023-04-26 DIAGNOSIS — A029 Salmonella infection, unspecified: Secondary | ICD-10-CM

## 2023-04-26 DIAGNOSIS — A09 Infectious gastroenteritis and colitis, unspecified: Secondary | ICD-10-CM | POA: Diagnosis not present

## 2023-04-26 DIAGNOSIS — E860 Dehydration: Secondary | ICD-10-CM | POA: Diagnosis not present

## 2023-04-26 LAB — CBC WITH DIFFERENTIAL/PLATELET
Abs Immature Granulocytes: 0.03 10*3/uL (ref 0.00–0.07)
Basophils Absolute: 0 10*3/uL (ref 0.0–0.1)
Basophils Relative: 1 %
Eosinophils Absolute: 0.1 10*3/uL (ref 0.0–0.5)
Eosinophils Relative: 3 %
HCT: 35.1 % — ABNORMAL LOW (ref 39.0–52.0)
Hemoglobin: 11.6 g/dL — ABNORMAL LOW (ref 13.0–17.0)
Immature Granulocytes: 1 %
Lymphocytes Relative: 28 %
Lymphs Abs: 1.3 10*3/uL (ref 0.7–4.0)
MCH: 29.1 pg (ref 26.0–34.0)
MCHC: 33 g/dL (ref 30.0–36.0)
MCV: 88.2 fL (ref 80.0–100.0)
Monocytes Absolute: 0.6 10*3/uL (ref 0.1–1.0)
Monocytes Relative: 13 %
Neutro Abs: 2.5 10*3/uL (ref 1.7–7.7)
Neutrophils Relative %: 54 %
Platelets: 217 10*3/uL (ref 150–400)
RBC: 3.98 MIL/uL — ABNORMAL LOW (ref 4.22–5.81)
RDW: 12.7 % (ref 11.5–15.5)
WBC: 4.6 10*3/uL (ref 4.0–10.5)
nRBC: 0 % (ref 0.0–0.2)

## 2023-04-26 LAB — COMPREHENSIVE METABOLIC PANEL
ALT: 53 U/L — ABNORMAL HIGH (ref 0–44)
AST: 87 U/L — ABNORMAL HIGH (ref 15–41)
Albumin: 3.3 g/dL — ABNORMAL LOW (ref 3.5–5.0)
Alkaline Phosphatase: 64 U/L (ref 38–126)
Anion gap: 8 (ref 5–15)
BUN: 5 mg/dL — ABNORMAL LOW (ref 6–20)
CO2: 27 mmol/L (ref 22–32)
Calcium: 8.9 mg/dL (ref 8.9–10.3)
Chloride: 104 mmol/L (ref 98–111)
Creatinine, Ser: 0.89 mg/dL (ref 0.61–1.24)
GFR, Estimated: >60 mL/min (ref >60)
Glucose, Bld: 108 mg/dL — ABNORMAL HIGH (ref 70–99)
Potassium: 3.5 mmol/L (ref 3.5–5.1)
Sodium: 139 mmol/L (ref 135–145)
Total Bilirubin: 0.4 mg/dL (ref <1.2)
Total Protein: 6.1 g/dL — ABNORMAL LOW (ref 6.5–8.1)

## 2023-04-26 LAB — GASTROINTESTINAL PANEL BY PCR, STOOL (REPLACES STOOL CULTURE)

## 2023-04-26 LAB — PHOSPHORUS: Phosphorus: 3.1 mg/dL (ref 2.5–4.6)

## 2023-04-26 LAB — MAGNESIUM: Magnesium: 1.3 mg/dL — ABNORMAL LOW (ref 1.7–2.4)

## 2023-04-26 MED ORDER — CIPROFLOXACIN HCL 500 MG PO TABS
500.0000 mg | ORAL_TABLET | Freq: Two times a day (BID) | ORAL | Status: DC
  Administered 2023-04-26 – 2023-04-27 (×2): 500 mg via ORAL
  Filled 2023-04-26 (×2): qty 1

## 2023-04-26 MED ORDER — ADULT MULTIVITAMIN W/MINERALS CH
1.0000 | ORAL_TABLET | Freq: Every day | ORAL | Status: DC
  Administered 2023-04-27: 1 via ORAL
  Filled 2023-04-26: qty 1

## 2023-04-26 MED ORDER — MAGNESIUM SULFATE 4 GM/100ML IV SOLN
4.0000 g | Freq: Once | INTRAVENOUS | Status: AC
  Administered 2023-04-26: 4 g via INTRAVENOUS
  Filled 2023-04-26: qty 100

## 2023-04-26 MED ORDER — ORAL CARE MOUTH RINSE: 15.0000 mL | OROMUCOSAL | Status: DC | PRN

## 2023-04-26 MED ORDER — POTASSIUM CHLORIDE CRYS ER 20 MEQ PO TBCR
40.0000 meq | EXTENDED_RELEASE_TABLET | Freq: Two times a day (BID) | ORAL | Status: AC
  Administered 2023-04-26 (×2): 40 meq via ORAL
  Filled 2023-04-26 (×2): qty 2

## 2023-04-26 MED ORDER — ENSURE ENLIVE PO LIQD
237.0000 mL | Freq: Two times a day (BID) | ORAL | Status: DC
  Administered 2023-04-27: 237 mL via ORAL

## 2023-04-26 NOTE — Progress Notes (Signed)
PROGRESS NOTE    MARKANTHONY LOCKLAR  JYN:[id number] DOB: [birth year] DOA: 04/22/2023 PCP: Practice, [facility]   Brief Narrative:  James Dudley is a 52 y.o. male with a history of HTN, HLD, headaches, and coronary calcifications who presented to the ED on 04/22/2023 with diarrhea found to have profuse, nonbloody, but +FOBT, diarrhea, AKI, and hypotension responsive to IV fluids. His mother was admitted for salmonella infection previously, so he was started on ciprofloxacin and admitted with GI pathogen panel is now positive for Salmonella. He is improving significantly and anticipating D/C in the next 24 hours.    Assessment and Plan:  Diarrhea in the setting of Salmonella Species -Initially suspected to be due to Salmonella infection with +close contact and now Confirmed -Continued to have liquids stools, and poor oral intake, food aversion but is much improved from yesterday  -GI pathogen panel is POSITIVE for Salmonella Species -C/w Empiric FQ Tx is warranted due to close contact who was positive and his risk factors, namely coronary atherosclerotic (83rd %ile CAC score Nov 2022) disease and severity of presentation (requiring hospitalization). -Output decreasing and appetite is improving so will not renew IV fluid hydration today as he was on D5 1/2 NS at 75 mL/hr -Continuing ciprofloxacin for treatment but will change to p.o. 500 mg po BID now -Get Nutritionist on board for further evaluation  -If positive FOBT persists after this acute episode, would recommend he get colonoscopy but this can be done in outpatient setting given that he is improving -Anticipating discharge in the next 24 to 48 hours   AKI, improved  -Prerenal, has resolved back to his putative baseline Cr with fair urine output.  -BUN/Cr Trend: Recent Labs  Lab 04/22/23 1046 04/23/23 0452 04/24/23 0436 04/26/23 0433  BUN 39* 21* 15 5*  CREATININE 1.75* 0.87 0.95 0.89  -Still poor oral intake and ongoing  GI losses, will continue IV fluids, change to balanced crystalloid with dextrose and K supplementation. Monitor again in AM.  Avoid Nephrotoxic Medications, Contrast Dyes, Hypotension and Dehydration to Ensure Adequate Renal Perfusion and will need to Renally Adjust Meds -Continue to Monitor and Trend Renal Function carefully and repeat CMP in the AM    Acute Urinary Retention, improved -Continue serial bladder scans. UA unremarkable micro with +dipstick protein (30 Protein)   Pancytopenia, improving  -Unveiled with large volume resuscitation.  -No bleeding grossly. Plt normalized, hgb stabilized. WBC rebounding and now normalized. -CBC Trend: Recent Labs  Lab 04/22/23 1046 04/23/23 0452 04/24/23 0436 04/26/23 0433  WBC 5.3 2.8* 3.2* 4.6  HGB 14.1 11.3* 11.5* 11.6*  HCT 43.0 35.2* 35.1* 35.1*  MCV 88.3 90.0 88.4 88.2  PLT 163 136* 163 217  -Ok to continue VTE ppx in absence of gross bleeding as hgb is still >11 and plt >100k.   Abnormal LFTs, improving  Hyperbilirubinemia, improved -Likely reactive to primary process as above.  -Acute Hepatitis Negative  -AST/ALT Trend: Recent Labs  Lab 04/22/23 1046 04/24/23 0436 04/26/23 0433  AST 180* 114* 87*  ALT 52* 47* 53*  BILITOT 1.4* 1.0 0.4  -Check RUQ U/S if necessary but will hold off -Continue to Monitor and Trend Hepatic Fxn Panel -Repeat CMP in the AM    HTN -Holding losartan with AKI and metoprolol as well with hypotension on admission.  -Currently normotensive.  -Continue to Monitor BP per Protocol -Last BP reading was 134/92   HLD -With Abnormal LFT and elevations, will  continue to hold statin for right  now and likely can resume at D/C    Hypokalemia -Patient's K+ Level Trend: Recent Labs  Lab 04/22/23 1046 04/23/23 0452 04/24/23 0436 04/26/23 0433  K 3.2* 3.4* 3.9 3.5  -Replete with po Kcl 40 mEQ BID x2 -Continue to Monitor and Replete as Necessary -Repeat CMP in the AM   Hypomagnesemia -Patient's  Mag Level Trend: Recent Labs  Lab 04/26/23 0433  MG 1.3*  -Replete with IV Mag Sulfate 4 grams -Continue to Monitor and Replete as Necessary -Repeat Mag in the AM   Hypoalbuminemia -Patient's Albumin Trend: Recent Labs  Lab 04/22/23 1046 04/24/23 0436 04/26/23 0433  ALBUMIN 4.1 3.0* 3.3*  -Continue to Monitor and Trend and repeat CMP in the AM   DVT prophylaxis: enoxaparin (LOVENOX) injection 40 mg Start: 04/22/23 2200 SCDs Start: 04/22/23 1447    Code Status: Full Code Family Communication: No family present at bedside   Disposition Plan:  Level of care: Med-Surg Status is: Inpatient Remains inpatient appropriate because: Has electrolyte abnormalities that need to be corrected and anticipating D/C in the next 24 hours   Consultants:  None  Procedures:  As delineated as above  Antimicrobials:  Anti-infectives (From admission, onward)    Start     Dose/Rate Route Frequency Ordered Stop   04/26/23 2000  ciprofloxacin (CIPRO) tablet 500 mg        500 mg Oral 2 times daily 04/26/23 1401     04/22/23 1500  ciprofloxacin (CIPRO) IVPB 400 mg  Status:  Discontinued        400 mg 200 mL/hr over 60 Minutes Intravenous Every 12 hours 04/22/23 1448 04/26/23 1401       Subjective: Seen and examined at bedside and his diarrhea is improving.  His food aversion is also improving and he was able to tolerate outside food from the hospital and ate [employer] today.  Feels better today compared to yesterday and stronger.  No other concerns or points at this time.  Objective: Vitals:   04/25/23 1149 04/25/23 1945 04/26/23 0454 04/26/23 1239  BP: (!) 128/94 104/73 109/79 (!) 134/92  Pulse: (!) 103 83 87 97  Resp: 18 16 16 18   Temp: 98.5 F (36.9 C) 97.7 F (36.5 C) 98.2 F (36.8 C) 98.2 F (36.8 C)  TempSrc:      SpO2: 98% 98% 100% 100%  Weight:      Height:        Intake/Output Summary (Last 24 hours) at 04/26/2023 1446 Last data filed at 04/26/2023 1444 Gross per 24  hour  Intake 2208.76 ml  Output --  Net 2208.76 ml   Filed Weights   04/22/23 1022  Weight: 74.8 kg   Examination: Physical Exam:  Constitutional: WN/WD Caucasian male in no acute distress appears calm Respiratory: Diminished to auscultation bilaterally, no wheezing, rales, rhonchi or crackles. Normal respiratory effort and patient is not tachypenic. No accessory muscle use.  Unlabored breathing Cardiovascular: RRR, no murmurs / rubs / gallops. S1 and S2 auscultated. No extremity edema.  Abdomen: Soft, non-tender, non-distended. Bowel sounds positive.  GU: Deferred. Musculoskeletal: No clubbing / cyanosis of digits/nails. No joint deformity upper and lower extremities.  Skin: No rashes, lesions, ulcers on limited skin evaluation. No induration; Warm and dry.  Neurologic: CN 2-12 grossly intact with no focal deficits. Romberg sign cerebellar reflexes not assessed.  Psychiatric: Normal judgment and insight. Alert and oriented x 3. Normal mood and appropriate affect.   Data Reviewed: I have personally reviewed following labs and imaging  studies  CBC: Recent Labs  Lab 04/22/23 1046 04/23/23 0452 04/24/23 0436 04/26/23 0433  WBC 5.3 2.8* 3.2* 4.6  NEUTROABS  --   --  1.9 2.5  HGB 14.1 11.3* 11.5* 11.6*  HCT 43.0 35.2* 35.1* 35.1*  MCV 88.3 90.0 88.4 88.2  PLT 163 136* 163 217   Basic Metabolic Panel: Recent Labs  Lab 04/22/23 1046 04/23/23 0452 04/24/23 0436 04/26/23 0433  NA 128* 132* 137 139  K 3.2* 3.4* 3.9 3.5  CL 95* 102 103 104  CO2 22 20* 25 27  GLUCOSE 110* 89 105* 108*  BUN 39* 21* 15 5*  CREATININE 1.75* 0.87 0.95 0.89  CALCIUM 8.6* 7.9* 8.5* 8.9  MG  --   --   --  1.3*  PHOS  --   --   --  3.1   GFR: Estimated Creatinine Clearance: 98 mL/min (by C-G formula based on SCr of 0.89 mg/dL). Liver Function Tests: Recent Labs  Lab 04/22/23 1046 04/24/23 0436 04/26/23 0433  AST 180* 114* 87*  ALT 52* 47* 53*  ALKPHOS 75 59 64  BILITOT 1.4* 1.0 0.4   PROT 7.5 5.7* 6.1*  ALBUMIN 4.1 3.0* 3.3*   Recent Labs  Lab 04/22/23 1046  LIPASE 23   No results for input(s): "AMMONIA" in the last 168 hours. Coagulation Profile: No results for input(s): "INR", "PROTIME" in the last 168 hours. Cardiac Enzymes: No results for input(s): "CKTOTAL", "CKMB", "CKMBINDEX", "TROPONINI" in the last 168 hours. BNP (last 3 results) No results for input(s): "PROBNP" in the last 8760 hours. HbA1C: No results for input(s): "HGBA1C" in the last 72 hours. CBG: No results for input(s): "GLUCAP" in the last 168 hours. Lipid Profile: No results for input(s): "CHOL", "HDL", "LDLCALC", "TRIG", "CHOLHDL", "LDLDIRECT" in the last 72 hours. Thyroid Function Tests: No results for input(s): "TSH", "T4TOTAL", "FREET4", "T3FREE", "THYROIDAB" in the last 72 hours. Anemia Panel: No results for input(s): "VITAMINB12", "FOLATE", "FERRITIN", "TIBC", "IRON", "RETICCTPCT" in the last 72 hours. Sepsis Labs: No results for input(s): "PROCALCITON", "LATICACIDVEN" in the last 168 hours.  Recent Results (from the past 240 hours)  Gastrointestinal Panel by PCR , Stool     Status: Abnormal   Collection Time: 04/25/23 12:40 PM   Specimen: Stool  Result Value Ref Range Status   Campylobacter species NOT DETECTED NOT DETECTED Final   Plesimonas shigelloides NOT DETECTED NOT DETECTED Final   Salmonella species DETECTED (A) NOT DETECTED Final    Comment: RESULT CALLED TO, READ BACK BY AND VERIFIED WITH: Leane Platt RN 1044 04/26/23 HNM    Yersinia enterocolitica NOT DETECTED NOT DETECTED Final   Vibrio species NOT DETECTED NOT DETECTED Final   Vibrio cholerae NOT DETECTED NOT DETECTED Final   Enteroaggregative E coli (EAEC) NOT DETECTED NOT DETECTED Final   Enteropathogenic E coli (EPEC) NOT DETECTED NOT DETECTED Final   Enterotoxigenic E coli (ETEC) NOT DETECTED NOT DETECTED Final   Shiga like toxin producing E coli (STEC) NOT DETECTED NOT DETECTED Final    Shigella/Enteroinvasive E coli (EIEC) NOT DETECTED NOT DETECTED Final   Cryptosporidium NOT DETECTED NOT DETECTED Final   Cyclospora cayetanensis NOT DETECTED NOT DETECTED Final   Entamoeba histolytica NOT DETECTED NOT DETECTED Final   Giardia lamblia NOT DETECTED NOT DETECTED Final   Adenovirus F40/41 NOT DETECTED NOT DETECTED Final   Astrovirus NOT DETECTED NOT DETECTED Final   Norovirus GI/GII NOT DETECTED NOT DETECTED Final   Rotavirus A NOT DETECTED NOT DETECTED Final   Sapovirus (  I, II, IV, and V) NOT DETECTED NOT DETECTED Final    Comment: Performed at [facility], [address], [place], [place] [address]    Radiology Studies: No results found.  Scheduled Meds:  amitriptyline  100 mg Oral q1600   ciprofloxacin  500 mg Oral BID   enoxaparin (LOVENOX) injection  40 mg Subcutaneous QHS   potassium chloride  40 mEq Oral BID   Continuous Infusions:  dextrose 5 % and 0.45 % NaCl with KCl 20 mEq/L 75 mL/hr at 04/26/23 0926   magnesium sulfate bolus IVPB 4 g (04/26/23 1248)    LOS: 3 days   Marguerita Merles, DO [facility] Available via Epic secure chat 7am-7pm After these hours, please refer to coverage provider listed on [email or web address] 04/26/2023, 2:46 PM

## 2023-04-26 NOTE — Progress Notes (Signed)
Nutrition Follow-up  DOCUMENTATION CODES:   Not applicable  INTERVENTION:  - Regular diet.  - Ensure Plus High Protein po BID, each supplement provides 350 kcal and 20 grams of protein. - Encourage intake.  - Multivitamin with minerals daily - Monitor weight trends.   NUTRITION DIAGNOSIS:   Increased nutrient needs related to acute illness as evidenced by estimated needs.  GOAL:   Patient will meet greater than or equal to 90% of their needs  MONITOR:   PO intake, Supplement acceptance, Weight trends  REASON FOR ASSESSMENT:   Consult Assessment of nutrition requirement/status, Poor PO  ASSESSMENT:   52 y.o. male with a history of HTN, HLD, headaches, and coronary calcifications who presented with diarrhea found to have salmonella infection and AKI.   Patient endorses a UBW of 170# and weight loss prior to current illness due to sporadic eating. Unsure if he has lost any since being sick over the past 6 days.  Per EMR, weight stable over the past year. Weight this admission close to weight taken in September.   Patient endorses typically eating 1 or 2 meals a day at home. Notes he eats a lot of "junk food" such as cheetos and candy. Admits he isn't always that hungry and will often go a few days without eating anything.   Since diarrhea and infection began ~6 days ago, he reports eating very little. Appetite has been poor and for the first few days (Saturday-Tuesday) he reports food tasted and smelled awful to him.   Thankfully, he reports this has resolved as of yesterday and food tastes and smells good again. Had 6 small mandarin oranges yesterday and had a sausage biscuit brought in by his father for breakfast this morning. His appetite is now improved and he is excited to eat more. Agreeable to try supplements to support intake.    Medications reviewed and include: D5 @ 15mL/hr (provides 306 kcals over 24 hours)  Labs reviewed:  Magnesium 1.3   NUTRITION -  FOCUSED PHYSICAL EXAM:  Flowsheet Row Most Recent Value  Orbital Region Mild depletion  Upper Arm Region No depletion  Thoracic and Lumbar Region No depletion  Buccal Region Mild depletion  Temple Region Mild depletion  Clavicle Bone Region No depletion  Clavicle and Acromion Bone Region No depletion  Scapular Bone Region Unable to assess  Dorsal Hand No depletion  Patellar Region No depletion  Anterior Thigh Region No depletion  Posterior Calf Region No depletion  Edema (RD Assessment) None  Hair Reviewed  Eyes Reviewed  Mouth Reviewed  Skin Reviewed  Nails Reviewed       Diet Order:   Diet Order             Diet regular Fluid consistency: Thin  Diet effective now                   EDUCATION NEEDS:  Education needs have been addressed  Skin:  Skin Assessment: Reviewed RN Assessment  Last BM:  12/12 - type 7  Height:  Ht Readings from Last 1 Encounters:  04/22/23 5\' 10"  (1.778 m)   Weight:  Wt Readings from Last 1 Encounters:  04/22/23 74.8 kg    BMI:  Body mass index is 23.68 kg/m.  Estimated Nutritional Needs:  Kcal:  2000-2250 kcals Protein:  80-100 grams Fluid:  >/= 2L    Shelle Iron RD, LDN Contact via Secure Chat.

## 2023-04-26 NOTE — Plan of Care (Signed)

## 2023-04-27 DIAGNOSIS — E860 Dehydration: Secondary | ICD-10-CM | POA: Diagnosis not present

## 2023-04-27 DIAGNOSIS — A020 Salmonella enteritis: Secondary | ICD-10-CM

## 2023-04-27 DIAGNOSIS — A09 Infectious gastroenteritis and colitis, unspecified: Secondary | ICD-10-CM | POA: Diagnosis not present

## 2023-04-27 LAB — COMPREHENSIVE METABOLIC PANEL
ALT: 82 U/L — ABNORMAL HIGH (ref 0–44)
AST: 102 U/L — ABNORMAL HIGH (ref 15–41)
Albumin: 3.2 g/dL — ABNORMAL LOW (ref 3.5–5.0)
Alkaline Phosphatase: 70 U/L (ref 38–126)
Anion gap: 7 (ref 5–15)
BUN: 6 mg/dL (ref 6–20)
CO2: 26 mmol/L (ref 22–32)
Calcium: 8.6 mg/dL — ABNORMAL LOW (ref 8.9–10.3)
Chloride: 104 mmol/L (ref 98–111)
Creatinine, Ser: 0.92 mg/dL (ref 0.61–1.24)
GFR, Estimated: >60 mL/min (ref >60)
Glucose, Bld: 108 mg/dL — ABNORMAL HIGH (ref 70–99)
Potassium: 3.9 mmol/L (ref 3.5–5.1)
Sodium: 137 mmol/L (ref 135–145)
Total Bilirubin: 0.4 mg/dL (ref <1.2)
Total Protein: 6.1 g/dL — ABNORMAL LOW (ref 6.5–8.1)

## 2023-04-27 LAB — CBC WITH DIFFERENTIAL/PLATELET
Abs Immature Granulocytes: 0.05 10*3/uL (ref 0.00–0.07)
Basophils Absolute: 0 10*3/uL (ref 0.0–0.1)
Basophils Relative: 1 %
Eosinophils Absolute: 0.2 10*3/uL (ref 0.0–0.5)
Eosinophils Relative: 3 %
HCT: 36.9 % — ABNORMAL LOW (ref 39.0–52.0)
Hemoglobin: 11.9 g/dL — ABNORMAL LOW (ref 13.0–17.0)
Immature Granulocytes: 1 %
Lymphocytes Relative: 20 %
Lymphs Abs: 1.3 10*3/uL (ref 0.7–4.0)
MCH: 28.7 pg (ref 26.0–34.0)
MCHC: 32.2 g/dL (ref 30.0–36.0)
MCV: 89.1 fL (ref 80.0–100.0)
Monocytes Absolute: 0.8 10*3/uL (ref 0.1–1.0)
Monocytes Relative: 12 %
Neutro Abs: 4.1 10*3/uL (ref 1.7–7.7)
Neutrophils Relative %: 63 %
Platelets: 247 10*3/uL (ref 150–400)
RBC: 4.14 MIL/uL — ABNORMAL LOW (ref 4.22–5.81)
RDW: 12.8 % (ref 11.5–15.5)
WBC: 6.4 10*3/uL (ref 4.0–10.5)
nRBC: 0 % (ref 0.0–0.2)

## 2023-04-27 LAB — GLUCOSE, CAPILLARY
Glucose-Capillary: 103 mg/dL — ABNORMAL HIGH (ref 70–99)
Glucose-Capillary: 77 mg/dL (ref 70–99)

## 2023-04-27 LAB — MAGNESIUM: Magnesium: 1.9 mg/dL (ref 1.7–2.4)

## 2023-04-27 LAB — PHOSPHORUS: Phosphorus: 3.5 mg/dL (ref 2.5–4.6)

## 2023-04-27 MED ORDER — ENSURE ENLIVE PO LIQD: 237.0000 mL | Freq: Two times a day (BID) | ORAL | 12 refills | Status: AC

## 2023-04-27 MED ORDER — ADULT MULTIVITAMIN W/MINERALS CH: 1.0000 | ORAL_TABLET | Freq: Every day | ORAL | 0 refills | Status: AC

## 2023-04-27 MED ORDER — CIPROFLOXACIN HCL 500 MG PO TABS: 500.0000 mg | ORAL_TABLET | Freq: Two times a day (BID) | ORAL | 0 refills | Status: AC

## 2023-04-27 MED ORDER — SODIUM CHLORIDE 0.9 % IV BOLUS
1000.0000 mL | Freq: Once | INTRAVENOUS | Status: AC
  Administered 2023-04-27: 1000 mL via INTRAVENOUS

## 2023-04-27 MED ORDER — ONDANSETRON HCL 4 MG PO TABS: 4.0000 mg | ORAL_TABLET | Freq: Four times a day (QID) | ORAL | 0 refills | Status: AC | PRN

## 2023-04-27 NOTE — Discharge Summary (Signed)
Physician Discharge Summary   Patient: James Dudley MRN: [id number] DOB: July 16, 1970  Admit date:     04/22/2023  Discharge date: 04/27/23  Discharge Physician: Marguerita Merles, DO   PCP: Practice, [facility]   Recommendations at discharge:   Follow-up with PCP within 1 to 2 weeks and repeat CBC, CMP, mag, Phos within 1 week; if LFTs are still elevated on repeat CMP within 1 week and recommend PCP to initiate further workup with a right upper quadrant ultrasound and acute hepatitis panel Follow-up with gastroenterology as outpatient setting for positive FOBT and hospital follow-up for Salmonella; colonoscopy is already scheduled for the end of January and if necessary the GI physicians could also follow-up on the abnormal LFTs and initiate further workup  Discharge Diagnoses: Principal Problem:   Diarrhea Active Problems:   Dehydration  Resolved Problems:   * No resolved hospital problems. *  Hospital Course: James Dudley is a 52 y.o. male with a history of HTN, HLD, headaches, and coronary calcifications who presented to the ED on 04/22/2023 with diarrhea found to have profuse, nonbloody, but +FOBT, diarrhea, AKI, and hypotension responsive to IV fluids. His mother was admitted for salmonella infection previously, so he was started on ciprofloxacin and admitted with GI pathogen panel is now positive for Salmonella. He is improving significantly and anticipating D/C in the next 24 hours.    Assessment and Plan:  Diarrhea in the setting of Salmonella Species -Initially suspected to be due to Salmonella infection with +close contact and now Confirmed -Continued to have liquids stools, and poor oral intake, food aversion but is much improved from yesterday  -GI pathogen panel is POSITIVE for Salmonella Species -C/w Empiric FQ Tx is warranted due to close contact who was positive and his risk factors, namely coronary atherosclerotic (83rd %ile CAC score Nov 2022) disease and  severity of presentation (requiring hospitalization). -Output decreasing and appetite is improving so will not renew IV fluid hydration today as he was on D5 1/2 NS at 75 mL/hr -Continuing ciprofloxacin for treatment but will change to p.o. 500 mg po BID now -Get Nutritionist on board for further evaluation  -If positive FOBT persists after this acute episode, would recommend he get colonoscopy but this can be done in outpatient setting given that he is improving -Anticipating discharge in the next 24 to 48 hours   AKI, improved  -Prerenal, has resolved back to his putative baseline Cr with fair urine output.  -BUN/Cr Trend: Recent Labs  Lab 04/22/23 1046 04/23/23 0452 04/24/23 0436 04/26/23 0433  BUN 39* 21* 15 5*  CREATININE 1.75* 0.87 0.95 0.89  -Still poor oral intake and ongoing GI losses, will continue IV fluids, change to balanced crystalloid with dextrose and K supplementation. Monitor again in AM.  Avoid Nephrotoxic Medications, Contrast Dyes, Hypotension and Dehydration to Ensure Adequate Renal Perfusion and will need to Renally Adjust Meds -Continue to Monitor and Trend Renal Function carefully and repeat CMP in the AM    Acute Urinary Retention, improved -Continue serial bladder scans. UA unremarkable micro with +dipstick protein (30 Protein)   Pancytopenia, improving  -Unveiled with large volume resuscitation.  -No bleeding grossly. Plt normalized, hgb stabilized. WBC rebounding and now normalized. -CBC Trend: Recent Labs  Lab 04/22/23 1046 04/23/23 0452 04/24/23 0436 04/26/23 0433  WBC 5.3 2.8* 3.2* 4.6  HGB 14.1 11.3* 11.5* 11.6*  HCT 43.0 35.2* 35.1* 35.1*  MCV 88.3 90.0 88.4 88.2  PLT 163 136* 163 217  -Ok to  continue VTE ppx in absence of gross bleeding as hgb is still >11 and plt >100k.   Abnormal LFTs, improving  Hyperbilirubinemia, improved -Likely reactive to primary process as above.  -Acute Hepatitis Negative  -AST/ALT Trend: Recent Labs  Lab  04/22/23 1046 04/24/23 0436 04/26/23 0433  AST 180* 114* 87*  ALT 52* 47* 53*  BILITOT 1.4* 1.0 0.4  -Check RUQ U/S if necessary but will hold off -Continue to Monitor and Trend Hepatic Fxn Panel -Repeat CMP in the AM    HTN -Holding losartan with AKI and metoprolol as well with hypotension on admission.  -Currently normotensive.  -Continue to Monitor BP per Protocol -Last BP reading was 134/92   HLD -With Abnormal LFT and elevations, will  continue to hold statin for right now and likely can resume at D/C    Hypokalemia -Patient's K+ Level Trend: Recent Labs  Lab 04/22/23 1046 04/23/23 0452 04/24/23 0436 04/26/23 0433  K 3.2* 3.4* 3.9 3.5  -Replete with po Kcl 40 mEQ BID x2 -Continue to Monitor and Replete as Necessary -Repeat CMP in the AM   Hypomagnesemia -Patient's Mag Level Trend: Recent Labs  Lab 04/26/23 0433  MG 1.3*  -Replete with IV Mag Sulfate 4 grams -Continue to Monitor and Replete as Necessary -Repeat Mag in the AM   Hypoalbuminemia -Patient's Albumin Trend: Recent Labs  Lab 04/22/23 1046 04/24/23 0436 04/26/23 0433  ALBUMIN 4.1 3.0* 3.3*  -Continue to Monitor and Trend and repeat CMP in the AM  Assessment and Plan: No notes have been filed under this hospital service. Service: Hospitalist     {Tip this will not be part of the note when signed Body mass index is 23.68 kg/m. ,  Nutrition Documentation    Flowsheet Row ED to Hosp-Admission (Discharged) from 04/22/2023 in [facility]  Nutrition Problem Increased nutrient needs  Etiology acute illness  Nutrition Goal Patient will meet greater than or equal to 90% of their needs  Interventions Refer to RD note for recommendations, Ensure Enlive (each supplement provides 350kcal and 20 grams of protein), MVI     ,  (Optional):26781}  {(NOTE) Pain control PDMP Statment (Optional):26782} Consultants: *** Procedures performed: ***  Disposition:  {Plan; Disposition:26390} Diet recommendation:  Discharge Diet Orders (From admission, onward)     Start     Ordered   04/27/23 0000  Diet - low sodium heart healthy        04/27/23 1226           {Diet_Plan:26776} DISCHARGE MEDICATION: Allergies as of 04/27/2023       Reactions   Other Hives   Darvocet-n   Codeine Rash   Penicillins Rash   Tramadol         Medication List     PAUSE taking these medications    atorvastatin 80 MG tablet Wait to take this until your doctor or other care provider tells you to start again. Commonly known as: LIPITOR TAKE 1 TABLET BY MOUTH DAILY       TAKE these medications    amitriptyline 50 MG tablet Commonly known as: ELAVIL Take 100 mg by mouth daily.   ciprofloxacin 500 MG tablet Commonly known as: CIPRO Take 1 tablet (500 mg total) by mouth 2 (two) times daily for 2 days.   feeding supplement Liqd Take 237 mLs by mouth 2 (two) times daily between meals.   losartan 25 MG tablet Commonly known as: COZAAR TAKE 1 TABLET BY MOUTH DAILY  metoprolol succinate 25 MG 24 hr tablet Commonly known as: TOPROL-XL TAKE 1 TABLET BY MOUTH DAILY   multivitamin with minerals Tabs tablet Take 1 tablet by mouth daily. Start taking on: April 28, 2023   ondansetron 4 MG tablet Commonly known as: ZOFRAN Take 1 tablet (4 mg total) by mouth every 6 (six) hours as needed for nausea.        Discharge Exam: Filed Weights   04/22/23 1022  Weight: 74.8 kg   ***  Condition at discharge: {DC Condition:26389}  The results of significant diagnostics from this hospitalization (including imaging, microbiology, ancillary and laboratory) are listed below for reference.   Imaging Studies: No results found.  Microbiology: Results for orders placed or performed during the hospital encounter of 04/22/23  Gastrointestinal Panel by PCR , Stool     Status: Abnormal   Collection Time: 04/25/23 12:40 PM   Specimen: Stool  Result  Value Ref Range Status   Campylobacter species NOT DETECTED NOT DETECTED Final   Plesimonas shigelloides NOT DETECTED NOT DETECTED Final   Salmonella species DETECTED (A) NOT DETECTED Final    Comment: RESULT CALLED TO, READ BACK BY AND VERIFIED WITH: Leane Platt RN 1044 04/26/23 HNM    Yersinia enterocolitica NOT DETECTED NOT DETECTED Final   Vibrio species NOT DETECTED NOT DETECTED Final   Vibrio cholerae NOT DETECTED NOT DETECTED Final   Enteroaggregative E coli (EAEC) NOT DETECTED NOT DETECTED Final   Enteropathogenic E coli (EPEC) NOT DETECTED NOT DETECTED Final   Enterotoxigenic E coli (ETEC) NOT DETECTED NOT DETECTED Final   Shiga like toxin producing E coli (STEC) NOT DETECTED NOT DETECTED Final   Shigella/Enteroinvasive E coli (EIEC) NOT DETECTED NOT DETECTED Final   Cryptosporidium NOT DETECTED NOT DETECTED Final   Cyclospora cayetanensis NOT DETECTED NOT DETECTED Final   Entamoeba histolytica NOT DETECTED NOT DETECTED Final   Giardia lamblia NOT DETECTED NOT DETECTED Final   Adenovirus F40/41 NOT DETECTED NOT DETECTED Final   Astrovirus NOT DETECTED NOT DETECTED Final   Norovirus GI/GII NOT DETECTED NOT DETECTED Final   Rotavirus A NOT DETECTED NOT DETECTED Final   Sapovirus (I, II, IV, and V) NOT DETECTED NOT DETECTED Final    Comment: Performed at [facility], [address] [place], [place] [address]    Labs: CBC: Recent Labs  Lab 04/22/23 1046 04/23/23 0452 04/24/23 0436 04/26/23 0433 04/27/23 0411  WBC 5.3 2.8* 3.2* 4.6 6.4  NEUTROABS  --   --  1.9 2.5 4.1  HGB 14.1 11.3* 11.5* 11.6* 11.9*  HCT 43.0 35.2* 35.1* 35.1* 36.9*  MCV 88.3 90.0 88.4 88.2 89.1  PLT 163 136* 163 217 247   Basic Metabolic Panel: Recent Labs  Lab 04/22/23 1046 04/23/23 0452 04/24/23 0436 04/26/23 0433 04/27/23 0411  NA 128* 132* 137 139 137  K 3.2* 3.4* 3.9 3.5 3.9  CL 95* 102 103 104 104  CO2 22 20* 25 27 26   GLUCOSE 110* 89 105* 108* 108*  BUN 39* 21*  15 5* 6  CREATININE 1.75* 0.87 0.95 0.89 0.92  CALCIUM 8.6* 7.9* 8.5* 8.9 8.6*  MG  --   --   --  1.3* 1.9  PHOS  --   --   --  3.1 3.5   Liver Function Tests: Recent Labs  Lab 04/22/23 1046 04/24/23 0436 04/26/23 0433 04/27/23 0411  AST 180* 114* 87* 102*  ALT 52* 47* 53* 82*  ALKPHOS 75 59 64 70  BILITOT 1.4* 1.0 0.4  0.4  PROT 7.5 5.7* 6.1* 6.1*  ALBUMIN 4.1 3.0* 3.3* 3.2*   CBG: Recent Labs  Lab 04/27/23 0728 04/27/23 1134  GLUCAP 103* 77    Discharge time spent: {LESS THAN/GREATER THAN:26388} 30 minutes.  Signed: Merlene Laughter, DO [facility] 04/27/2023

## 2023-04-27 NOTE — Plan of Care (Signed)

## 2023-05-22 LAB — MISCELLANEOUS TEST

## 2023-05-25 NOTE — Progress Notes (Signed)
 Virtual Visit via Telephone Note   Because of James Dudley's co-morbid illnesses, he is at least at moderate risk for complications without adequate follow up.  This format is felt to be most appropriate for this patient at this time.  The patient did not have access to video technology/had technical difficulties with video requiring transitioning to audio format only (telephone).  All issues noted in this document were discussed and addressed.  No physical exam could be performed with this format.  Please refer to the patient's chart for his consent to telehealth for [facility].  Evaluation Performed:  Preoperative cardiovascular risk assessment _____________   Date:  05/28/2023   Patient ID:  James Dudley, DOB June 12, 1970, MRN [id number] Patient Location:  Home Provider location:   Office  Primary Care Provider:  Practice, [facility] Primary Cardiologist:  Lonni LITTIE Nanas, MD  Chief Complaint / Patient Profile   53 y.o. y/o male with a h/o hyperlipidemia, chest pain with nonobstructive CAD per coronary CTA on 04/06/2021, systolic CHF with an EF of 45 to 50% with grade 1 diastolic dysfunction, cardiac MRI revealing an EF of 54%.  Repeat echocardiogram in 2024 improved EF of 55%.   He is pending colonoscopy with Dr. Rosalie on 06/13/2023.  He presents today for telephonic preoperative cardiovascular risk assessment.  History of Present Illness    James Dudley is a 53 y.o. male who presents via audio/video conferencing for a telehealth visit today.  Pt was last seen in cardiology clinic on 01/22/2023 by Dr. Marlyn.  At that time James Dudley was doing well .  He has since been hospitalized with discharge on 04/27/2023 in the setting of diarrhea with Salmonella species infection.  He was planned for colonoscopy on discharge.  The patient is now pending procedure as outlined above. Since his last visit, he has not had any cardiac issues.  He is disabled  and is using a cane for ambulation as a results of musculoskeletal deficiencies, and back pain. He is able to complete ADL's and do some exertional activities but is very limited. He denies any chest pain, DOE, palpitations, fatigue or dizziness. He is medically compliant.   Past Medical History    Past Medical History:  Diagnosis Date   Back injury    Collapsed lung    Hyperlipemia    Sciatic pain    Past Surgical History:  Procedure Laterality Date   BACK SURGERY     LUNG SURGERY     LUNG SURGERY     atalectasis    Allergies  Allergies  Allergen Reactions   Other Hives    Darvocet-n   Codeine Rash   Penicillins Rash   Tramadol     Home Medications    Prior to Admission medications   Medication Sig Start Date End Date Taking? Authorizing Provider  amitriptyline  (ELAVIL ) 50 MG tablet Take 100 mg by mouth daily.    [provider]  atorvastatin  (LIPITOR) 80 MG tablet TAKE 1 TABLET BY MOUTH DAILY 08/22/22   Nanas Lonni LITTIE, MD  feeding supplement (ENSURE ENLIVE / ENSURE PLUS) LIQD Take 237 mLs by mouth 2 (two) times daily between meals. 04/27/23   Sherrill Cable Latif, DO  losartan  (COZAAR ) 25 MG tablet TAKE 1 TABLET BY MOUTH DAILY 03/20/23   Nanas Lonni LITTIE, MD  metoprolol  succinate (TOPROL -XL) 25 MG 24 hr tablet TAKE 1 TABLET BY MOUTH DAILY 12/19/22   Nanas Lonni LITTIE, MD  Multiple Vitamin (  MULTIVITAMIN WITH MINERALS) TABS tablet Take 1 tablet by mouth daily. 04/28/23   Sherrill Cable Latif, DO  ondansetron  (ZOFRAN ) 4 MG tablet Take 1 tablet (4 mg total) by mouth every 6 (six) hours as needed for nausea. 04/27/23   Sherrill Cable Donovan, DO    Physical Exam    Vital Signs:  James Dudley does not have vital signs available for review today.  Given telephonic nature of communication, physical exam is limited. AAOx3. NAD. Normal affect.  Speech and respirations are unlabored.  Accessory Clinical Findings    None  Assessment & Plan    1.   Preoperative Cardiovascular Risk Assessment: According to the Revised Cardiac Risk Index (RCRI), his Perioperative Risk of Major Cardiac Event is (%): 0.9  His Functional Capacity in METs is: 6.36 according to the [facility] Activity Status Index (DASI). Activity is limited by back injury and use of cane for ambulation.    He has confirmed that he is not on anticoagulation or antiplatelet therapy.   The patient was advised that if he develops new symptoms prior to surgery to contact our office to arrange for a follow-up visit, and he verbalized understanding.  Today, I have spent 10 minutes with the patient with telehealth technology discussing medical history, symptoms, and management plan.     James Satterfield, NP  05/28/2023, 9:41 AM

## 2023-05-28 ENCOUNTER — Ambulatory Visit: Payer: Medicaid Other | Attending: Cardiology

## 2023-05-28 DIAGNOSIS — Z01818 Encounter for other preprocedural examination: Secondary | ICD-10-CM

## 2023-05-28 DIAGNOSIS — Z01810 Encounter for preprocedural cardiovascular examination: Secondary | ICD-10-CM

## 2023-07-23 NOTE — Progress Notes (Unsigned)
 Cardiology Office Note:    Date:  07/25/2023   ID:  James Dudley, DOB 02/21/1971, MRN [id number]  PCP:  Practice, [facility]  Cardiologist:  Little Ishikawa, MD  Electrophysiologist:  None   Referring MD: Practice, Duanne Limerick*   Chief Complaint  Patient presents with   Congestive Heart Failure    History of Present Illness:    James Dudley is a 53 y.o. male with a hx of heart failure with recovered ejection fraction, hyperlipidemia who presents for follow-up.  He was referred by Lonie Peak, PA for evaluation of shortness of breath and chest pain, initially seen on 03/28/2021.  He reports he has been having shortness of breath for the past year.  States that he is short of breath with minimal exertion.  Also has chest pain about once per month.  Describes as sharp pain on left side of chest.  Does not notice what brings it on.  Also gets diaphoretic during episodes of shortness of breath and feels lightheaded, but no syncope.  Denies any palpitations or lower extremity edema.  He had COVID-19 infection in March 2022 and lost 30 pounds.  Has smoked 2 packs/day for over 30 years, quit at age 68.  Family history includes father has had 5 PCIs, first in late 40s/early 41s.  Coronary CTA on 04/06/2021 showed mild nonobstructive CAD, calcium score 83 (83rd percentile).  Echo on 04/27/2021 showed EF 45 to 50%, grade 1 diastolic dysfunction, normal RV function, no significant valvular disease.  Cardiac MRI on 06/08/2021 showed LVEF 54%, RVEF 52%, no LGE.  Zio patch x14 days on 07/18/2021 showed no significant arrhythmias.  Echocardiogram 07/2022 showed EF 55%, normal RV function, no significant valvular disease.  Since last clinic visit, he reports she is doing okay.  Reports continues to have occasional episodes of lightheadedness and near syncope.  Occurs with standing.  Denies any chest pain, dyspnea,  lower extremity edema, or palpitations.    Past Medical History:   Diagnosis Date   Back injury    Collapsed lung    Hyperlipemia    Sciatic pain     Past Surgical History:  Procedure Laterality Date   BACK SURGERY     LUNG SURGERY     LUNG SURGERY     atalectasis    Current Medications: Current Meds  Medication Sig   amitriptyline (ELAVIL) 50 MG tablet Take 100 mg by mouth daily.   [Paused] atorvastatin (LIPITOR) 80 MG tablet TAKE 1 TABLET BY MOUTH DAILY   feeding supplement (ENSURE ENLIVE / ENSURE PLUS) LIQD Take 237 mLs by mouth 2 (two) times daily between meals.   losartan (COZAAR) 25 MG tablet TAKE 1 TABLET BY MOUTH DAILY   Multiple Vitamin (MULTIVITAMIN WITH MINERALS) TABS tablet Take 1 tablet by mouth daily.   [DISCONTINUED] metoprolol succinate (TOPROL-XL) 25 MG 24 hr tablet TAKE 1 TABLET BY MOUTH DAILY     Allergies:   Other, Codeine, Penicillins, and Tramadol   Social History   Socioeconomic History   Marital status: Single    Spouse name: Not on file   Number of children: Not on file   Years of education: Not on file   Highest education level: Not on file  Occupational History   Not on file  Tobacco Use   Smoking status: Former    Current packs/day: 1.00    Average packs/day: 1 pack/day for 25.0 years (25.0 ttl pk-yrs)    Types: Cigarettes   Smokeless tobacco:  Never  Substance and Sexual Activity   Alcohol use: No   Drug use: No   Sexual activity: Not Currently    Partners: Female    Comment: single  Other Topics Concern   Not on file  Social History Narrative   Not on file   Social Drivers of Health   Financial Resource Strain: Not on file  Food Insecurity: No Food Insecurity (04/22/2023)   Hunger Vital Sign    Worried About Running Out of Food in the Last Year: Never true    Ran Out of Food in the Last Year: Never true  Transportation Needs: No Transportation Needs (04/22/2023)   PRAPARE - Administrator, Civil Service (Medical): No    Lack of Transportation (Non-Medical): No  Physical  Activity: Not on file  Stress: Not on file  Social Connections: Unknown (09/23/2021)   Received from [facility], [facility]   Social Network    Social Network: Not on file     Family History: The patient's family history includes Diabetes in his mother; Hypertension in an other family member.  ROS:   Please see the history of present illness.     All other systems reviewed and are negative.  EKGs/Labs/Other Studies Reviewed:    The following studies were reviewed today:   EKG:   03/28/21: normal sinus rhythm, rate 83, no ST abnormalities 03/06/22: Normal sinus rhythm, rate 91, PACs 01/22/2023: Normal sinus rhythm, rate 74, no ST abnormalities  Recent Labs: 04/27/2023: ALT 82; BUN 6; Creatinine, Ser 0.92; Hemoglobin 11.9; Magnesium 1.9; Platelets 247; Potassium 3.9; Sodium 137  Recent Lipid Panel    Component Value Date/Time   CHOL 126 03/06/2022 0921   TRIG 60 03/06/2022 0921   HDL 61 03/06/2022 0921   CHOLHDL 2.1 03/06/2022 0921   CHOLHDL 5.6 Ratio 02/11/2010 2147   VLDL 43 (H) 02/11/2010 2147   LDLCALC 52 03/06/2022 0921    Physical Exam:    VS:  BP 128/70   Pulse 92   Ht 5\' 10"  (1.778 m)   Wt 156 lb 3.2 oz (70.9 kg)   SpO2 98%   BMI 22.41 kg/m     Wt Readings from Last 3 Encounters:  07/24/23 156 lb 3.2 oz (70.9 kg)  04/22/23 165 lb (74.8 kg)  01/22/23 166 lb 9.6 oz (75.6 kg)     GEN:  Well nourished, well developed in no acute distress HEENT: Normal NECK: No JVD; No carotid bruits CARDIAC: RRR, no murmurs, rubs, gallops RESPIRATORY:  Clear to auscultation without rales, wheezing or rhonchi  ABDOMEN: Soft, non-tender, non-distended MUSCULOSKELETAL:  No edema; No deformity  SKIN: Warm and dry NEUROLOGIC:  Alert and oriented x 3 PSYCHIATRIC:  Normal affect   ASSESSMENT:    1. Heart failure with improved ejection fraction (HFimpEF) (HCC)   2. Syncope and collapse   3. Coronary artery disease involving native coronary artery of native heart  without angina pectoris   4. Hyperlipidemia, unspecified hyperlipidemia type     PLAN:    Heart failure with improved EF: EF 45 to 50% on echo 04/27/2021.  Nonobstructive CAD on coronary CTA 04/06/2021.  Cardiac MRI on 06/08/2021 showed LVEF 54%, RVEF 52%, no LGE.  Appears euvolemic.  Echocardiogram 07/2022 showed EF 55%, normal RV function, no significant valvular disease. -Continue losartan 25 mg daily -Continue Toprol-XL, will reduce dose to 12.5 mg daily given symptoms suggesting orthostasis  Syncope: Description concerning for arrhythmia given lack of prodromal symptoms.  Zio patch  x14 days on 07/18/2021 showed no significant arrhythmias. -Denies any recent syncope but reported lightheadedness.  Orthostatics at prior clinic visit showed mild drop in BP with standing but not meeting criteria for orthostatic hypotension (Lying 130/85 > 117/80 standing, improved to 123/95 at 3 minutes).  Encouraged to stay hydrated and trial compression stockings.  Will reduce toprol XL dose as above  CAD: Reported atypical chest pain.  Coronary CTA on 04/06/2021 showed mild nonobstructive CAD, calcium score 83 (83rd percentile).  Echo on 04/27/2021 showed EF 45 to 50%, grade 1 diastolic dysfunction, normal RV function, no significant valvular disease.   -Continue atorvastatin 80 mg daily  Hyperlipidemia: On simvastatin 20 mg daily.  LDL 127 on 01/19/2021.  Calcium score 83 (83rd percentile) on 04/06/2021.  Switched to atorvastatin 40 mg daily.  LDL 97 on 06/21/2021.  Atorvastatin increased to 80 mg daily.  LDL 44 on 08/30/22   RTC in 6 months   Medication Adjustments/Labs and Tests Ordered: Current medicines are reviewed at length with the patient today.  Concerns regarding medicines are outlined above.  No orders of the defined types were placed in this encounter.   Meds ordered this encounter  Medications   metoprolol succinate (TOPROL-XL) 25 MG 24 hr tablet    Sig: Take 0.5 tablets (12.5 mg total) by mouth  daily.    Dispense:  45 tablet    Refill:  1     Patient Instructions  Medication Instructions:  Decrease:  Metoprolol (Toprol XL) to 12.5 mg (half a tablet) once daily *If you need a refill on your cardiac medications before your next appointment, please call your pharmacy*  Lab Work: None  Testing/Procedures: None  Follow-Up: At [facility], you and your health needs are our priority.  As part of our continuing mission to provide you with exceptional heart care, we have created designated Provider Care Teams.  These Care Teams include your primary Cardiologist (physician) and Advanced Practice Providers (APPs -  Physician Assistants and Nurse Practitioners) who all work together to provide you with the care you need, when you need it.  We recommend signing up for the patient portal called "MyChart".  Sign up information is provided on this After Visit Summary.  MyChart is used to connect with patients for Virtual Visits (Telemedicine).  Patients are able to view lab/test results, encounter notes, upcoming appointments, etc.  Non-urgent messages can be sent to your provider as well.   To learn more about what you can do with MyChart, go to [email or web address].    Your next appointment:   6 month(s)  Provider:   Little Ishikawa, MD          Signed, Little Ishikawa, MD  07/25/2023 8:41 AM    [facility]

## 2023-07-24 ENCOUNTER — Encounter: Payer: Self-pay | Admitting: Cardiology

## 2023-07-24 ENCOUNTER — Ambulatory Visit: Payer: Medicaid Other | Attending: Cardiology | Admitting: Cardiology

## 2023-07-24 VITALS — BP 128/70 | HR 92 | Ht 70.0 in | Wt 156.2 lb

## 2023-07-24 DIAGNOSIS — I5032 Chronic diastolic (congestive) heart failure: Secondary | ICD-10-CM

## 2023-07-24 DIAGNOSIS — E785 Hyperlipidemia, unspecified: Secondary | ICD-10-CM

## 2023-07-24 DIAGNOSIS — R55 Syncope and collapse: Secondary | ICD-10-CM | POA: Diagnosis not present

## 2023-07-24 DIAGNOSIS — I2510 Atherosclerotic heart disease of native coronary artery without angina pectoris: Secondary | ICD-10-CM

## 2023-07-24 MED ORDER — METOPROLOL SUCCINATE ER 25 MG PO TB24: 12.5000 mg | ORAL_TABLET | Freq: Every day | ORAL | 1 refills | Status: DC

## 2023-07-24 NOTE — Patient Instructions (Signed)
 Medication Instructions:  Decrease:  Metoprolol (Toprol XL) to 12.5 mg (half a tablet) once daily *If you need a refill on your cardiac medications before your next appointment, please call your pharmacy*  Lab Work: None  Testing/Procedures: None  Follow-Up: At [facility], you and your health needs are our priority.  As part of our continuing mission to provide you with exceptional heart care, we have created designated Provider Care Teams.  These Care Teams include your primary Cardiologist (physician) and Advanced Practice Providers (APPs -  Physician Assistants and Nurse Practitioners) who all work together to provide you with the care you need, when you need it.  We recommend signing up for the patient portal called "MyChart".  Sign up information is provided on this After Visit Summary.  MyChart is used to connect with patients for Virtual Visits (Telemedicine).  Patients are able to view lab/test results, encounter notes, upcoming appointments, etc.  Non-urgent messages can be sent to your provider as well.   To learn more about what you can do with MyChart, go to [email or web address].    Your next appointment:   6 month(s)  Provider:   Little Ishikawa, MD

## 2023-09-25 ENCOUNTER — Other Ambulatory Visit: Payer: Self-pay | Admitting: Physician Assistant

## 2023-09-25 DIAGNOSIS — Z87891 Personal history of nicotine dependence: Secondary | ICD-10-CM

## 2023-10-15 ENCOUNTER — Ambulatory Visit
Admission: RE | Admit: 2023-10-15 | Discharge: 2023-10-15 | Disposition: A | Discharge status: Home / Self Care | Source: Ambulatory Visit | Attending: Physician Assistant | Admitting: Physician Assistant

## 2023-10-15 DIAGNOSIS — Z87891 Personal history of nicotine dependence: Secondary | ICD-10-CM

## 2023-12-18 ENCOUNTER — Other Ambulatory Visit: Payer: Self-pay | Admitting: Cardiology

## 2024-01-20 NOTE — Progress Notes (Unsigned)
 Cardiology Office Note:    Date:  01/21/2024   ID:  James Dudley, DOB 10/04/1970, MRN [id number]  PCP:  [facility]  Cardiologist:  James LITTIE Nanas, MD  Electrophysiologist:  None   Referring MD: [facility]   Chief Complaint  Patient presents with   Dizziness         History of Present Illness:    James Dudley is a 53 y.o. male with a hx of heart failure with recovered ejection fraction, hyperlipidemia who presents for follow-up.  He was referred by Rankin Dike, PA for evaluation of shortness of breath and chest pain, initially seen on 03/28/2021.  He reports he has been having shortness of breath for the past year.  States that he is short of breath with minimal exertion.  Also has chest pain about once per month.  Describes as sharp pain on left side of chest.  Does not notice what brings it on.  Also gets diaphoretic during episodes of shortness of breath and feels lightheaded, but no syncope.  Denies any palpitations or lower extremity edema.  He had COVID-19 infection in March 2022 and lost 30 pounds.  Has smoked 2 packs/day for over 30 years, quit at age 11.  Family history includes father has had 5 PCIs, first in late 40s/early 37s.  Coronary CTA on 04/06/2021 showed mild nonobstructive CAD, calcium  score 83 (83rd percentile).  Echo on 04/27/2021 showed EF 45 to 50%, grade 1 diastolic dysfunction, normal RV function, no significant valvular disease.  Cardiac MRI on 06/08/2021 showed LVEF 54%, RVEF 52%, no LGE.  Zio patch x14 days on 07/18/2021 showed no significant arrhythmias.  Echocardiogram 07/2022 showed EF 55%, normal RV function, no significant valvular disease.  Since last clinic visit, he reports he is doing okay.  States that he stopped metoprolol  due to soft BP.  He has been having lightheadedness but denies any syncope.  He reports his BP has improved.  Also started taking his losartan  at night which has helped.  Denies any chest  pain, dyspnea, lower extremity edema, or palpitations.   Past Medical History:  Diagnosis Date   Back injury    Collapsed lung    Hyperlipemia    Sciatic pain     Past Surgical History:  Procedure Laterality Date   BACK SURGERY     LUNG SURGERY     LUNG SURGERY     atalectasis    Current Medications: Current Meds  Medication Sig   amitriptyline  (ELAVIL ) 50 MG tablet Take 100 mg by mouth daily.   atorvastatin  (LIPITOR) 80 MG tablet TAKE 1 TABLET BY MOUTH DAILY   losartan  (COZAAR ) 25 MG tablet TAKE 1 TABLET BY MOUTH DAILY   ondansetron  (ZOFRAN ) 4 MG tablet Take 1 tablet (4 mg total) by mouth every 6 (six) hours as needed for nausea.     Allergies:   Other, Codeine, Penicillins, and Tramadol   Social History   Socioeconomic History   Marital status: Single    Spouse name: Not on file   Number of children: Not on file   Years of education: Not on file   Highest education level: Not on file  Occupational History   Not on file  Tobacco Use   Smoking status: Former    Current packs/day: 1.00    Average packs/day: 1 pack/day for 25.0 years (25.0 ttl pk-yrs)    Types: Cigarettes   Smokeless tobacco: Never  Substance and Sexual Activity  Alcohol use: No   Drug use: No   Sexual activity: Not Currently    Partners: Female    Comment: single  Other Topics Concern   Not on file  Social History Narrative   Not on file   Social Drivers of Health   Financial Resource Strain: Not on file  Food Insecurity: No Food Insecurity (04/22/2023)   Hunger Vital Sign    Worried About Running Out of Food in the Last Year: Never true    Ran Out of Food in the Last Year: Never true  Transportation Needs: No Transportation Needs (04/22/2023)   PRAPARE - Administrator, Civil Service (Medical): No    Lack of Transportation (Non-Medical): No  Physical Activity: Not on file  Stress: Not on file  Social Connections: Unknown (09/23/2021)   Received from [facility]    Social Network    Social Network: Not on file     Family History: The patient's family history includes Diabetes in his mother; Hypertension in an other family member.  ROS:   Please see the history of present illness.     All other systems reviewed and are negative.  EKGs/Labs/Other Studies Reviewed:    The following studies were reviewed today:   EKG:   03/28/21: normal sinus rhythm, rate 83, no ST abnormalities 03/06/22: Normal sinus rhythm, rate 91, PACs 01/22/2023: Normal sinus rhythm, rate 74, no ST abnormalities 01/21/2024: Normal sinus rhythm, PACs, rate 96  Recent Labs: 04/27/2023: ALT 82; BUN 6; Creatinine, Ser 0.92; Hemoglobin 11.9; Magnesium  1.9; Platelets 247; Potassium 3.9; Sodium 137  Recent Lipid Panel    Component Value Date/Time   CHOL 126 03/06/2022 0921   TRIG 60 03/06/2022 0921   HDL 61 03/06/2022 0921   CHOLHDL 2.1 03/06/2022 0921   CHOLHDL 5.6 Ratio 02/11/2010 2147   VLDL 43 (H) 02/11/2010 2147   LDLCALC 52 03/06/2022 0921    Physical Exam:    VS:  BP 118/78 (BP Location: Right Arm, Patient Position: Sitting, Cuff Size: Normal)   Pulse 98   Ht 5' 10 (1.778 m)   Wt 150 lb (68 kg)   SpO2 99%   BMI 21.52 kg/m     Wt Readings from Last 3 Encounters:  01/21/24 150 lb (68 kg)  07/24/23 156 lb 3.2 oz (70.9 kg)  04/22/23 165 lb (74.8 kg)     GEN:  Well nourished, well developed in no acute distress HEENT: Normal NECK: No JVD; No carotid bruits CARDIAC: RRR, no murmurs, rubs, gallops RESPIRATORY:  Clear to auscultation without rales, wheezing or rhonchi  ABDOMEN: Soft, non-tender, non-distended MUSCULOSKELETAL:  No edema; No deformity  SKIN: Warm and dry NEUROLOGIC:  Alert and oriented x 3 PSYCHIATRIC:  Normal affect   ASSESSMENT:    1. Heart failure with improved ejection fraction (HFimpEF) (HCC)   2. Coronary artery disease involving native coronary artery of native heart without angina pectoris   3. Hyperlipidemia, unspecified  hyperlipidemia type   4. Lightheadedness     PLAN:    Heart failure with improved EF: EF 45 to 50% on echo 04/27/2021.  Nonobstructive CAD on coronary CTA 04/06/2021.  Cardiac MRI on 06/08/2021 showed LVEF 54%, RVEF 52%, no LGE.  Appears euvolemic.  Echocardiogram 07/2022 showed EF 55%, normal RV function, no significant valvular disease. -Continue losartan  25 mg daily -He stopped taking his Toprol -XL due to lightheadedness, reports has improved.  Will update echocardiogram since he stopped his metoprolol   Syncope/lightheadedness: Description concerning for arrhythmia  given lack of prodromal symptoms.  Zio patch x14 days on 07/18/2021 showed no significant arrhythmias. -Denies any recent syncope but reported lightheadedness.  Orthostatics at prior clinic visit showed mild drop in BP with standing but not meeting criteria for orthostatic hypotension (Lying 130/85 > 117/80 standing, improved to 123/95 at 3 minutes).  Encouraged to stay hydrated and trial compression stockings.  Reduced Toprol -XL dose to 12.5 mg daily but reported continued to have symptoms and Toprol -XL was discontinued.  Reports symptoms have significantly improved  CAD: Reported atypical chest pain.  Coronary CTA on 04/06/2021 showed mild nonobstructive CAD, calcium  score 83 (83rd percentile).  Echo on 04/27/2021 showed EF 45 to 50%, grade 1 diastolic dysfunction, normal RV function, no significant valvular disease.   -Continue atorvastatin  80 mg daily  Hyperlipidemia: On simvastatin 20 mg daily.  LDL 127 on 01/19/2021.  Calcium  score 83 (83rd percentile) on 04/06/2021.  Switched to atorvastatin  40 mg daily.  LDL 97 on 06/21/2021.  Atorvastatin  increased to 80 mg daily.  LDL 67 on 08/2023   RTC in 6 months   Medication Adjustments/Labs and Tests Ordered: Current medicines are reviewed at length with the patient today.  Concerns regarding medicines are outlined above.  Orders Placed This Encounter  Procedures   EKG 12-Lead    ECHOCARDIOGRAM COMPLETE    No orders of the defined types were placed in this encounter.    Patient Instructions  Medication Instructions:  Your physician recommends that you continue on your current medications as directed. Please refer to the Current Medication list given to you today.  *If you need a refill on your cardiac medications before your next appointment, please call your pharmacy*  Lab Work: none If you have labs (blood work) drawn today and your tests are completely normal, you will receive your results only by: MyChart Message (if you have MyChart) OR A paper copy in the mail If you have any lab test that is abnormal or we need to change your treatment, we will call you to review the results.  Testing/Procedures: Echo Your physician has requested that you have an echocardiogram. Echocardiography is a painless test that uses sound waves to create images of your heart. It provides your doctor with information about the size and shape of your heart and how well your heart's chambers and valves are working. This procedure takes approximately one hour. There are no restrictions for this procedure. Please do NOT wear cologne, perfume, aftershave, or lotions (deodorant is allowed). Please arrive 15 minutes prior to your appointment time.  Please note: We ask at that you not bring children with you during ultrasound (echo/ vascular) testing. Due to room size and safety concerns, children are not allowed in the ultrasound rooms during exams. Our front office staff cannot provide observation of children in our lobby area while testing is being conducted. An adult accompanying a patient to their appointment will only be allowed in the ultrasound room at the discretion of the ultrasound technician under special circumstances. We apologize for any inconvenience.   Follow-Up: At [facility], you and your health needs are our priority.  As part of our continuing mission to  provide you with exceptional heart care, our providers are all part of one team.  This team includes your primary Cardiologist (physician) and Advanced Practice Providers or APPs (Physician Assistants and Nurse Practitioners) who all work together to provide you with the care you need, when you need it.  Your next appointment:   6 months  Provider:   Lonni LITTIE Nanas, MD    We recommend signing up for the patient portal called MyChart.  Sign up information is provided on this After Visit Summary.  MyChart is used to connect with patients for Virtual Visits (Telemedicine).  Patients are able to view lab/test results, encounter notes, upcoming appointments, etc.  Non-urgent messages can be sent to your provider as well.   To learn more about what you can do with MyChart, go to [email or web address].   Other Instructions none       Signed, James LITTIE Nanas, MD  01/21/2024 8:48 AM    [facility]

## 2024-01-21 ENCOUNTER — Ambulatory Visit: Attending: Cardiology | Admitting: Cardiology

## 2024-01-21 VITALS — BP 118/78 | HR 98 | Ht 70.0 in | Wt 150.0 lb

## 2024-01-21 DIAGNOSIS — I2510 Atherosclerotic heart disease of native coronary artery without angina pectoris: Secondary | ICD-10-CM | POA: Diagnosis not present

## 2024-01-21 DIAGNOSIS — I5032 Chronic diastolic (congestive) heart failure: Secondary | ICD-10-CM | POA: Diagnosis not present

## 2024-01-21 DIAGNOSIS — E785 Hyperlipidemia, unspecified: Secondary | ICD-10-CM | POA: Diagnosis not present

## 2024-01-21 DIAGNOSIS — R42 Dizziness and giddiness: Secondary | ICD-10-CM | POA: Insufficient documentation

## 2024-01-21 NOTE — Patient Instructions (Signed)
 Medication Instructions:  Your physician recommends that you continue on your current medications as directed. Please refer to the Current Medication list given to you today.  *If you need a refill on your cardiac medications before your next appointment, please call your pharmacy*  Lab Work: none If you have labs (blood work) drawn today and your tests are completely normal, you will receive your results only by: MyChart Message (if you have MyChart) OR A paper copy in the mail If you have any lab test that is abnormal or we need to change your treatment, we will call you to review the results.  Testing/Procedures: Echo Your physician has requested that you have an echocardiogram. Echocardiography is a painless test that uses sound waves to create images of your heart. It provides your doctor with information about the size and shape of your heart and how well your heart's chambers and valves are working. This procedure takes approximately one hour. There are no restrictions for this procedure. Please do NOT wear cologne, perfume, aftershave, or lotions (deodorant is allowed). Please arrive 15 minutes prior to your appointment time.  Please note: We ask at that you not bring children with you during ultrasound (echo/ vascular) testing. Due to room size and safety concerns, children are not allowed in the ultrasound rooms during exams. Our front office staff cannot provide observation of children in our lobby area while testing is being conducted. An adult accompanying a patient to their appointment will only be allowed in the ultrasound room at the discretion of the ultrasound technician under special circumstances. We apologize for any inconvenience.   Follow-Up: At [facility], you and your health needs are our priority.  As part of our continuing mission to provide you with exceptional heart care, our providers are all part of one team.  This team includes your primary Cardiologist  (physician) and Advanced Practice Providers or APPs (Physician Assistants and Nurse Practitioners) who all work together to provide you with the care you need, when you need it.  Your next appointment:   6 months  Provider:   Lonni LITTIE Nanas, MD    We recommend signing up for the patient portal called MyChart.  Sign up information is provided on this After Visit Summary.  MyChart is used to connect with patients for Virtual Visits (Telemedicine).  Patients are able to view lab/test results, encounter notes, upcoming appointments, etc.  Non-urgent messages can be sent to your provider as well.   To learn more about what you can do with MyChart, go to [email or web address].   Other Instructions none

## 2024-02-26 ENCOUNTER — Ambulatory Visit (HOSPITAL_COMMUNITY)
Admission: RE | Admit: 2024-02-26 | Discharge: 2024-02-26 | Disposition: A | Discharge status: Home / Self Care | Source: Ambulatory Visit | Attending: Cardiology | Admitting: Cardiology

## 2024-02-26 ENCOUNTER — Ambulatory Visit: Payer: Self-pay | Admitting: Cardiology

## 2024-02-26 DIAGNOSIS — I5020 Unspecified systolic (congestive) heart failure: Secondary | ICD-10-CM | POA: Insufficient documentation

## 2024-02-26 LAB — ECHOCARDIOGRAM COMPLETE
Area-P 1/2: 3.74 cm2
S' Lateral: 2.55 cm

## 2024-06-05 ENCOUNTER — Other Ambulatory Visit: Payer: Self-pay | Admitting: Cardiology

## 2024-06-11 ENCOUNTER — Telehealth: Payer: Self-pay | Admitting: Cardiology

## 2024-06-11 MED ORDER — LOSARTAN POTASSIUM 25 MG PO TABS: 25.0000 mg | ORAL_TABLET | Freq: Every day | ORAL | 2 refills | Status: AC

## 2024-06-11 NOTE — Telephone Encounter (Signed)
" °*  STAT* If patient is at the pharmacy, call can be transferred to refill team.   1. Which medications need to be refilled? (please list name of each medication and dose if known)   losartan  (COZAAR ) 25 MG tablet    2. Would you like to learn more about the convenience, safety, & potential cost savings by using the [facility]? no   3. Are you open to using the [facility] (Type [facility]. no   4. Which pharmacy/location (including street and city if local pharmacy) is medication to be sent to?  [facility] - [place], [place] - [address] [place] [address]    5. Do they need a 30 day or 90 day supply?  90 day  Pt has been completely out since 1/22.  "

## 2024-06-12 NOTE — Telephone Encounter (Signed)
 Labs Completed on 03/03/24 [facility]

## 2024-06-12 NOTE — Telephone Encounter (Signed)
 Refill was sent 06/11/24.

## 2024-07-23 ENCOUNTER — Ambulatory Visit: Admitting: Cardiology
